# Patient Record
Sex: Female | Born: 1955 | Race: White | Hispanic: No | Marital: Married | Smoking: Never smoker
Health system: Southern US, Community
[De-identification: ages and names within clinical notes are randomized; demographics above are authoritative.]

## PROBLEM LIST (undated history)

## (undated) DIAGNOSIS — E119 Type 2 diabetes mellitus without complications: Secondary | ICD-10-CM

## (undated) DIAGNOSIS — N950 Postmenopausal bleeding: Secondary | ICD-10-CM

## (undated) DIAGNOSIS — G43909 Migraine, unspecified, not intractable, without status migrainosus: Secondary | ICD-10-CM

## (undated) HISTORY — PX: COLON BIOPSY: SHX1369

## (undated) HISTORY — DX: Migraine, unspecified, not intractable, without status migrainosus: G43.909

## (undated) HISTORY — PX: TONSILLECTOMY: SUR1361

## (undated) HISTORY — PX: OTHER SURGICAL HISTORY: SHX169

## (undated) HISTORY — PX: CARPAL TUNNEL RELEASE: SHX101

## (undated) HISTORY — DX: Type 2 diabetes mellitus without complications: E11.9

---

## 1999-02-16 ENCOUNTER — Encounter: Payer: Self-pay | Admitting: Obstetrics and Gynecology

## 1999-02-16 ENCOUNTER — Encounter: Admission: RE | Admit: 1999-02-16 | Discharge: 1999-02-16 | Payer: Self-pay | Admitting: Obstetrics and Gynecology

## 1999-07-04 ENCOUNTER — Other Ambulatory Visit: Admission: RE | Admit: 1999-07-04 | Discharge: 1999-07-04 | Payer: Self-pay | Admitting: Obstetrics and Gynecology

## 2000-02-22 ENCOUNTER — Encounter: Payer: Self-pay | Admitting: Obstetrics and Gynecology

## 2000-02-22 ENCOUNTER — Encounter: Admission: RE | Admit: 2000-02-22 | Discharge: 2000-02-22 | Payer: Self-pay | Admitting: Obstetrics and Gynecology

## 2000-04-02 ENCOUNTER — Other Ambulatory Visit: Admission: RE | Admit: 2000-04-02 | Discharge: 2000-04-02 | Payer: Self-pay | Admitting: Obstetrics and Gynecology

## 2007-06-17 ENCOUNTER — Ambulatory Visit (HOSPITAL_BASED_OUTPATIENT_CLINIC_OR_DEPARTMENT_OTHER): Admission: RE | Admit: 2007-06-17 | Discharge: 2007-06-17 | Payer: Self-pay | Admitting: Orthopaedic Surgery

## 2008-09-28 ENCOUNTER — Encounter: Admission: RE | Admit: 2008-09-28 | Discharge: 2008-09-28 | Payer: Self-pay | Admitting: Gynecology

## 2008-09-28 ENCOUNTER — Encounter: Payer: Self-pay | Admitting: Gynecology

## 2008-09-28 ENCOUNTER — Other Ambulatory Visit: Admission: RE | Admit: 2008-09-28 | Discharge: 2008-09-28 | Payer: Self-pay | Admitting: Gynecology

## 2008-09-28 ENCOUNTER — Ambulatory Visit: Payer: Self-pay | Admitting: Gynecology

## 2009-05-13 ENCOUNTER — Ambulatory Visit: Payer: Self-pay | Admitting: Gynecology

## 2009-07-04 ENCOUNTER — Ambulatory Visit: Payer: Self-pay | Admitting: Gynecology

## 2009-07-12 ENCOUNTER — Ambulatory Visit: Payer: Self-pay | Admitting: Gynecology

## 2009-10-07 ENCOUNTER — Encounter: Admission: RE | Admit: 2009-10-07 | Discharge: 2009-10-07 | Payer: Self-pay | Admitting: Gynecology

## 2009-10-07 ENCOUNTER — Other Ambulatory Visit: Admission: RE | Admit: 2009-10-07 | Discharge: 2009-10-07 | Payer: Self-pay | Admitting: Gynecology

## 2009-10-07 ENCOUNTER — Ambulatory Visit: Payer: Self-pay | Admitting: Gynecology

## 2009-10-13 ENCOUNTER — Ambulatory Visit: Payer: Self-pay | Admitting: Gynecology

## 2009-11-24 ENCOUNTER — Ambulatory Visit: Payer: Self-pay | Admitting: Gynecology

## 2010-03-19 ENCOUNTER — Emergency Department (HOSPITAL_BASED_OUTPATIENT_CLINIC_OR_DEPARTMENT_OTHER)
Admission: EM | Admit: 2010-03-19 | Discharge: 2010-03-19 | Payer: Self-pay | Source: Home / Self Care | Admitting: Emergency Medicine

## 2010-08-01 NOTE — Op Note (Signed)
NAMEALICYN, KLANN                  ACCOUNT NO.:  [id number]   MEDICAL RECORD NO.:  [id number]          PATIENT TYPE:  AMB   LOCATION:  DSC                          FACILITY:  [facility]   PHYSICIAN:  Lubertha Basque. Dalldorf, M.D.DATE OF BIRTH:  October 24, 1955   DATE OF PROCEDURE:  06/17/2007  DATE OF DISCHARGE:                               OPERATIVE REPORT   PREOPERATIVE DIAGNOSIS:  Right carpal tunnel syndrome.   POSTOPERATIVE DIAGNOSIS:  Right carpal tunnel syndrome.   PROCEDURE:  Right carpal tunnel release.   ANESTHESIA:  General.   ATTENDING SURGEON:  Marcene Corning, M.D.   ASSISTANT:  Lindwood Qua, PA.   INDICATIONS FOR PROCEDURE:  The patient is a 55 year old woman with a  long history of right hand numbness.  This has persisted despite oral  anti-inflammatories and a brace.  She has undergone a nerve conduction  study which shows significant carpal tunnel syndrome.  She is offered  release as she has pain using her hand.  Informed operative consent was  obtained after discussion of possible complications of reaction to  anesthesia, infection, and neurovascular injury.   SUMMARY OF FINDINGS AND PROCEDURE:  Under general anesthesia through a  small palmar incision, a right carpal tunnel release was performed.  She  did have moderate thickening of the transverse carpal ligament.  She was  closed primarily and discharged home.   DESCRIPTION OF PROCEDURE:  The patient was taken to the operating suite  where general anesthetic was applied without difficulty.  She was  positioned supine, prepped and draped in normal sterile fashion.  After  the administration of IV Kefzol the right arm was elevated,  exsanguinated, and tourniquet inflated about the upper arm.  A small  palmar incision was made which was ulnar to the thenar flexion crease  and did not cross the wrist flexion crease.  Dissection was carried down  through the palmar fascia to the transverse carpal ligament which was  released under direct visualization.  The release was taken distally  towards the transverse arch of vessels and proximally through the distal  fascia of the forearm.  The median nerve did appear to be moderately  compressed underneath.  The wound was irrigated followed by  reapproximation of the skin with vertical mattress nylon.  The  tourniquet was deflated and a small amount of bleeding was easily  controlled with some pressure.  Some Marcaine was injected about the  incision site followed by Adaptic and a dry gauze dressing with a volar  splint of plaster with the wrist in slight extension.  Estimated blood  loss and intraoperative fluids can be obtained from the Anesthesia  records as can accurate tourniquet time.   DISPOSITION:  The patient was extubated in the operating room and taken  to the recovery room in stable addition.  She was to go home same-day  and follow up in the office next week.  I will contact her by phone  tonight.      Lubertha Basque Jerl Santos, M.D.  Electronically Signed     PGD/MEDQ  D:  06/17/2007  T:  06/17/2007  Job:  [id number]

## 2010-09-22 ENCOUNTER — Other Ambulatory Visit: Payer: Self-pay | Admitting: Gynecology

## 2010-09-22 DIAGNOSIS — Z1231 Encounter for screening mammogram for malignant neoplasm of breast: Secondary | ICD-10-CM

## 2010-10-13 ENCOUNTER — Ambulatory Visit (INDEPENDENT_AMBULATORY_CARE_PROVIDER_SITE_OTHER): Payer: PRIVATE HEALTH INSURANCE | Admitting: Gynecology

## 2010-10-13 ENCOUNTER — Other Ambulatory Visit (HOSPITAL_COMMUNITY)
Admission: RE | Admit: 2010-10-13 | Discharge: 2010-10-13 | Disposition: A | Discharge status: Home / Self Care | Payer: PRIVATE HEALTH INSURANCE | Source: Ambulatory Visit | Attending: Gynecology | Admitting: Gynecology

## 2010-10-13 ENCOUNTER — Ambulatory Visit
Admission: RE | Admit: 2010-10-13 | Discharge: 2010-10-13 | Disposition: A | Discharge status: Home / Self Care | Payer: PRIVATE HEALTH INSURANCE | Source: Ambulatory Visit | Attending: Gynecology | Admitting: Gynecology

## 2010-10-13 ENCOUNTER — Encounter: Payer: Self-pay | Admitting: Gynecology

## 2010-10-13 VITALS — BP 130/78 | Ht 63.75 in | Wt 156.0 lb

## 2010-10-13 DIAGNOSIS — N898 Other specified noninflammatory disorders of vagina: Secondary | ICD-10-CM

## 2010-10-13 DIAGNOSIS — R5383 Other fatigue: Secondary | ICD-10-CM

## 2010-10-13 DIAGNOSIS — K921 Melena: Secondary | ICD-10-CM

## 2010-10-13 DIAGNOSIS — Z01419 Encounter for gynecological examination (general) (routine) without abnormal findings: Secondary | ICD-10-CM

## 2010-10-13 DIAGNOSIS — Z780 Asymptomatic menopausal state: Secondary | ICD-10-CM

## 2010-10-13 DIAGNOSIS — R5381 Other malaise: Secondary | ICD-10-CM

## 2010-10-13 DIAGNOSIS — Z833 Family history of diabetes mellitus: Secondary | ICD-10-CM

## 2010-10-13 DIAGNOSIS — Z13220 Encounter for screening for lipoid disorders: Secondary | ICD-10-CM

## 2010-10-13 DIAGNOSIS — Z1231 Encounter for screening mammogram for malignant neoplasm of breast: Secondary | ICD-10-CM

## 2010-10-13 DIAGNOSIS — R8290 Unspecified abnormal findings in urine: Secondary | ICD-10-CM

## 2010-10-13 DIAGNOSIS — R635 Abnormal weight gain: Secondary | ICD-10-CM

## 2010-10-13 DIAGNOSIS — R82998 Other abnormal findings in urine: Secondary | ICD-10-CM

## 2010-10-13 LAB — HEMOCCULT GUIAC POC 1CARD (OFFICE)

## 2010-10-13 MED ORDER — PROGESTERONE MICRONIZED 200 MG PO CAPS: 200.0000 mg | ORAL_CAPSULE | Freq: Every day | ORAL | Status: DC

## 2010-10-13 MED ORDER — ESTRADIOL 0.52 MG/0.87 GM (0.06%) TD GEL: 1.0000 "application " | Freq: Two times a day (BID) | TRANSDERMAL | Status: DC

## 2010-10-13 MED ORDER — FLUCONAZOLE 150 MG PO TABS: 150.0000 mg | ORAL_TABLET | Freq: Once | ORAL | Status: AC

## 2010-10-13 NOTE — Progress Notes (Signed)
Addended byCammie Mcgee T on: 10/13/2010 11:30 AM   Modules accepted: Orders

## 2010-10-13 NOTE — Progress Notes (Signed)
Subjective:     Patient ID: Christina Church, female   DOB: 25-Oct-1955, 55 y.o.   MRN: [id number]  HPI patient presented to the office today for her annual gynecological examination. She's on hormone replacement therapy consisting of Elestrin 0.06% transdermally which she applies on a daily basis. She also takes Prometrium 200 mg for 10 days of each month. Her complaint today as part of her annual exam was tired has fatigue and a slight vaginal discharge and odor in her urine. She denied any fever chills nausea or vomiting or any dysuria.   Review of Systems  Constitutional: Negative.   HENT: Negative.   Eyes: Negative.   Respiratory: Negative.   Cardiovascular: Negative.   Gastrointestinal: Negative.   Genitourinary: Negative.   Musculoskeletal: Negative.   Skin: Negative.   Neurological: Negative.   Hematological: Negative.   Psychiatric/Behavioral: Negative.        Objective:   Physical Exam  Constitutional: She is oriented to person, place, and time. She appears well-developed and well-nourished.  HENT:  Head: Normocephalic and atraumatic.  Eyes: Pupils are equal, round, and reactive to light.  Neck: Normal range of motion. Neck supple.  Cardiovascular: Normal rate and regular rhythm.   Pulmonary/Chest: Effort normal and breath sounds normal.  Abdominal: Soft. Bowel sounds are normal.  Genitourinary: Vagina normal and uterus normal.       Bartholin urethral Skene glands: Vagina: Normal Cervix: No gross lesions Uterus: Anteverted normal size shape and consistency Adnexa: No masses or tenderness Rectal: Small external hemorrhoidal tag  Musculoskeletal: Normal range of motion.  Neurological: She is alert and oriented to person, place, and time.  Skin: Skin is warm and dry.       Assessment:     Patient had unremarkable her annual gynecological examination. Wet prep demonstrated yeast and she'll be treated with Diflucan 150 mg 1 by mouth daily. Her urinalysis had 1+ bacteria  1-2 RBC and was sent for culture. She is scheduled for mammogram today. In her colonoscopies not due for 3 more years. She is due for bone density study next year.    Plan:     Patient will pass by the laboratory for the following labs: CBC, TSH, fasting lipid profile, fasting blood sugar. Her Hemoccult test was positive. She does have hemorrhoids. We'll give her Hemoccult tests kit to submit to the office next week. In the posterior present and referred to gastroenterologist. On palpation there is any abnormality in the above-mentioned test.

## 2010-10-13 NOTE — Patient Instructions (Signed)
Will notify if there is any abnormality of the left as there were drawn today. Your prescription for your hormone replacement therapies at her pharmacy. He right eye demonstrating a mild yeast infection and for this reason a carbon prescription for Diflucan to take one tablet today and 2 refills. Please make sure he gets her mammogram today in at the 4-6 copy of that report. Your colonoscopy is now due for 3 years. Your bone density study should be scheduled for next year.

## 2010-10-18 ENCOUNTER — Telehealth: Payer: Self-pay | Admitting: *Deleted

## 2010-10-18 NOTE — Telephone Encounter (Signed)
Patient called today with concerns that she again in the wrong information on her home replacement therapy. She is not taking Prometrium and she had told me she is taking Provera. We will reiterated to her once again the following: She should be applying Elestrin 0.06% transdermal gel once a day and 1 arm only. She is to take the Provera 10 mg for 10 days of the month only orally.

## 2010-10-18 NOTE — Telephone Encounter (Signed)
(  SEE OFFICE NOTE 10/13/10 EMR CHART) PT CALLED STATING THAT SHE TOLD YOU THE WRONG MEDICATION ON OV 10/13/10. SHE TAKES PROVERA 10MG  1PO Q DAY. NOT PROMETRIUM 200MG  Q DAY. PT WANTS TO KNOW DOES SHE STILL NEED TO TAKE THE PROMETRIUM SHE HAS AN RX FOR THIS ALREADY? BUT SHE DOES NEED REFILLS ON HER PROVERA. PLEASE ADVISE.

## 2010-10-19 ENCOUNTER — Other Ambulatory Visit: Payer: Self-pay | Admitting: *Deleted

## 2010-10-19 MED ORDER — MEDROXYPROGESTERONE ACETATE 10 MG PO TABS: 10.0000 mg | ORAL_TABLET | Freq: Every day | ORAL | Status: DC

## 2010-10-19 NOTE — Telephone Encounter (Signed)
PT INFORMED WITH THE BELOW RX REFILL SENT TO PHARMACY.

## 2010-10-26 DIAGNOSIS — Z1211 Encounter for screening for malignant neoplasm of colon: Secondary | ICD-10-CM

## 2010-10-30 ENCOUNTER — Other Ambulatory Visit: Payer: Self-pay | Admitting: *Deleted

## 2010-10-30 DIAGNOSIS — Z1211 Encounter for screening for malignant neoplasm of colon: Secondary | ICD-10-CM

## 2010-12-11 LAB — POCT HEMOGLOBIN-HEMACUE: Hemoglobin: 13.6

## 2011-05-10 ENCOUNTER — Other Ambulatory Visit: Payer: Self-pay | Admitting: *Deleted

## 2011-05-10 MED ORDER — MEDROXYPROGESTERONE ACETATE 10 MG PO TABS: ORAL_TABLET | ORAL | Status: DC

## 2011-09-06 ENCOUNTER — Other Ambulatory Visit: Payer: Self-pay | Admitting: Gynecology

## 2011-09-06 DIAGNOSIS — Z1231 Encounter for screening mammogram for malignant neoplasm of breast: Secondary | ICD-10-CM

## 2011-10-18 ENCOUNTER — Ambulatory Visit: Payer: PRIVATE HEALTH INSURANCE

## 2011-10-25 ENCOUNTER — Encounter: Payer: Self-pay | Admitting: Gynecology

## 2011-10-25 ENCOUNTER — Ambulatory Visit (INDEPENDENT_AMBULATORY_CARE_PROVIDER_SITE_OTHER): Payer: Managed Care, Other (non HMO) | Admitting: Gynecology

## 2011-10-25 ENCOUNTER — Ambulatory Visit
Admission: RE | Admit: 2011-10-25 | Discharge: 2011-10-25 | Disposition: A | Discharge status: Home / Self Care | Payer: Private Health Insurance - Indemnity | Source: Ambulatory Visit | Attending: Gynecology | Admitting: Gynecology

## 2011-10-25 VITALS — BP 126/78 | Ht 64.0 in | Wt 158.0 lb

## 2011-10-25 DIAGNOSIS — R5381 Other malaise: Secondary | ICD-10-CM

## 2011-10-25 DIAGNOSIS — R635 Abnormal weight gain: Secondary | ICD-10-CM

## 2011-10-25 DIAGNOSIS — R5383 Other fatigue: Secondary | ICD-10-CM | POA: Insufficient documentation

## 2011-10-25 DIAGNOSIS — Z1231 Encounter for screening mammogram for malignant neoplasm of breast: Secondary | ICD-10-CM

## 2011-10-25 DIAGNOSIS — Z01419 Encounter for gynecological examination (general) (routine) without abnormal findings: Secondary | ICD-10-CM

## 2011-10-25 DIAGNOSIS — N949 Unspecified condition associated with female genital organs and menstrual cycle: Secondary | ICD-10-CM

## 2011-10-25 DIAGNOSIS — N898 Other specified noninflammatory disorders of vagina: Secondary | ICD-10-CM

## 2011-10-25 DIAGNOSIS — Z79890 Hormone replacement therapy (postmenopausal): Secondary | ICD-10-CM

## 2011-10-25 LAB — CBC WITH DIFFERENTIAL/PLATELET
Basophils Absolute: 0 10*3/uL (ref 0.0–0.1)
Basophils Relative: 0 % (ref 0–1)
Hemoglobin: 13.7 g/dL (ref 12.0–15.0)
Lymphocytes Relative: 31 % (ref 12–46)
MCHC: 33.8 g/dL (ref 30.0–36.0)
Monocytes Relative: 5 % (ref 3–12)
Neutro Abs: 4.2 10*3/uL (ref 1.7–7.7)
Neutrophils Relative %: 62 % (ref 43–77)
WBC: 6.9 10*3/uL (ref 4.0–10.5)

## 2011-10-25 LAB — WET PREP FOR TRICH, YEAST, CLUE
Clue Cells Wet Prep HPF POC: NONE SEEN
Trich, Wet Prep: NONE SEEN
Yeast Wet Prep HPF POC: NONE SEEN

## 2011-10-25 LAB — URINALYSIS W MICROSCOPIC + REFLEX CULTURE
Hgb urine dipstick: NEGATIVE
Leukocytes, UA: NEGATIVE
Nitrite: NEGATIVE
Specific Gravity, Urine: 1.02 (ref 1.005–1.030)
pH: 7 (ref 5.0–8.0)

## 2011-10-25 LAB — COMPREHENSIVE METABOLIC PANEL
AST: 27 U/L (ref 0–37)
Albumin: 4.3 g/dL (ref 3.5–5.2)
Alkaline Phosphatase: 81 U/L (ref 39–117)
BUN: 18 mg/dL (ref 6–23)
Creat: 0.68 mg/dL (ref 0.50–1.10)
Glucose, Bld: 81 mg/dL (ref 70–99)
Total Bilirubin: 0.3 mg/dL (ref 0.3–1.2)

## 2011-10-25 LAB — CHOLESTEROL, TOTAL: Cholesterol: 182 mg/dL (ref 0–200)

## 2011-10-25 MED ORDER — ESTRADIOL 1 MG PO TABS: 1.0000 mg | ORAL_TABLET | Freq: Every day | ORAL | Status: DC

## 2011-10-25 MED ORDER — CLINDAMYCIN PHOSPHATE 2 % VA CREA: 1.0000 | TOPICAL_CREAM | Freq: Every day | VAGINAL | Status: AC

## 2011-10-25 MED ORDER — PROGESTERONE MICRONIZED 200 MG PO CAPS: ORAL_CAPSULE | ORAL | Status: DC

## 2011-10-25 NOTE — Progress Notes (Signed)
Christina Church [birth year] [id number]   History:    56 y.o.  for annual gyn exam who was complaining of vaginal odor as well as some tiredness and weight gain. Patient also states that despite her being on the elestrin 0.06% transdermal to pump daily she continues to have vasomotor symptoms. She is also taking Prometrium 200 mg for 12 days of the month. She is in a monogamous relationship. She's had some urinary frequency but no dysuria. Her mammogram was done today results pending. She does her monthly self breast examination. Last colonoscopy was in 2010 which was normal. Her last bone density study was normal in 2011. Patient had had it to her regimen for hormone replacement therapy a drug called Amberen which she stayed help some of her symptoms. I review the labile and contains no phytoestrogens.  Past medical history,surgical history, family history and social history were all reviewed and documented in the EPIC chart.  Gynecologic History Patient's last menstrual period was 10/13/2007. Contraception: none Last Pap: 2012. Results were: normal Last mammogram: Today. Results were: Results pending  Obstetric History OB History    Grav Para Term Preterm Abortions TAB SAB Ect Mult Living   1 1 1       1      # Outc Date GA Lbr Len/2nd Wgt Sex Del Anes PTL Lv   1 TRM     F SVD  No Yes       ROS: A ROS was performed and pertinent positives and negatives are included in the history.  GENERAL: No fevers or chills. HEENT: No change in vision, no earache, sore throat or sinus congestion. NECK: No pain or stiffness. CARDIOVASCULAR: No chest pain or pressure. No palpitations. PULMONARY: No shortness of breath, cough or wheeze. GASTROINTESTINAL: No abdominal pain, nausea, vomiting or diarrhea, melena or bright red blood per rectum. GENITOURINARY: No urinary frequency, urgency, hesitancy or dysuria. MUSCULOSKELETAL: No joint or muscle pain, no back pain, no recent trauma. DERMATOLOGIC: No rash, no itching,  no lesions. ENDOCRINE: No polyuria, polydipsia, no heat or cold intolerance. No recent change in weight. HEMATOLOGICAL: No anemia or easy bruising or bleeding. NEUROLOGIC: No headache, seizures, numbness, tingling or weakness. PSYCHIATRIC: No depression, no loss of interest in normal activity or change in sleep pattern.     Exam: chaperone present  BP 126/78  Ht 5\' 4"  (1.626 m)  Wt 158 lb (71.668 kg)  BMI 27.12 kg/m2  LMP 10/13/2007  Body mass index is 27.12 kg/(m^2).  General appearance : Well developed well nourished female. No acute distress HEENT: Neck supple, trachea midline, no carotid bruits, no thyroidmegaly Lungs: Clear to auscultation, no rhonchi or wheezes, or rib retractions  Heart: Regular rate and rhythm, no murmurs or gallops Breast:Examined in sitting and supine position were symmetrical in appearance, no palpable masses or tenderness,  no skin retraction, no nipple inversion, no nipple discharge, no skin discoloration, no axillary or supraclavicular lymphadenopathy Abdomen: no palpable masses or tenderness, no rebound or guarding Extremities: no edema or skin discoloration or tenderness  Pelvic:  Bartholin, Urethra, Skene Glands: Within normal limits             Vagina: No gross lesions or discharge  Cervix: No gross lesions or discharge  Uterus  anteverted, normal size, shape and consistency, non-tender and mobile  Adnexa  Without masses or tenderness  Anus and perineum  normal   Rectovaginal  normal sphincter tone without palpated masses or tenderness  Hemoccult cards provided for patient to submit to the office for testing.     Assessment/Plan:  56 y.o. female for annual exam who was complaining of some tiredness and fatigue. We'll check a comprehensive metabolic panel along with a TSH, CBC, total cholesterol, and urinalysis. We did discuss the new Pap smear screening guidelines and she will not need 1 for 2 more years. She will schedule her bone  density study for next month. Her wet prep demonstrated moderate amount of white blood cells and many bacteria. She was given a prescription for Cleocin vaginal cream to apply each bedtime for 5 days. After that she can use refresh a probiotic vaginal gel to 3 times a week. Her urinalysis was negative. She was reminded to do her monthly self breast examination. We discussed importance of calcium vitamin D for osteoporosis prevention. She was reminded to submit to the office the Hemoccult cards for testing. We're going to discontinue the elestrin transdermal estrogen and start her on the Estrace 1 mg by mouth daily with the addition of Prometrium 200 mg for 12 days of the month and monitor her symptoms.    Ok Edwards MD, 12:38 PM 10/25/2011

## 2011-10-25 NOTE — Patient Instructions (Addendum)
Exercise to Lose Weight Exercise and a healthy diet may help you lose weight. Your doctor may suggest specific exercises. EXERCISE IDEAS AND TIPS  Choose low-cost things you enjoy doing, such as walking, bicycling, or exercising to workout videos.   Take stairs instead of the elevator.   Walk during your lunch break.   Park your car further away from work or school.   Go to a gym or an exercise class.   Start with 5 to 10 minutes of exercise each day. Build up to 30 minutes of exercise 4 to 6 days a week.   Wear shoes with good support and comfortable clothes.   Stretch before and after working out.   Work out until you breathe harder and your heart beats faster.   Drink extra water when you exercise.   Do not do so much that you hurt yourself, feel dizzy, or get very short of breath.  Exercises that burn about 150 calories:  Running 1  miles in 15 minutes.   Playing volleyball for 45 to 60 minutes.   Washing and waxing a car for 45 to 60 minutes.   Playing touch football for 45 minutes.   Walking 1  miles in 35 minutes.   Pushing a stroller 1  miles in 30 minutes.   Playing basketball for 30 minutes.   Raking leaves for 30 minutes.   Bicycling 5 miles in 30 minutes.   Walking 2 miles in 30 minutes.   Dancing for 30 minutes.   Shoveling snow for 15 minutes.   Swimming laps for 20 minutes.   Walking up stairs for 15 minutes.   Bicycling 4 miles in 15 minutes.   Gardening for 30 to 45 minutes.   Jumping rope for 15 minutes.   Washing windows or floors for 45 to 60 minutes.  Document Released: 04/07/2010 Document Revised: 11/15/2010 Document Reviewed: 04/07/2010 [employer] Patient Information 2012 [employer].                                                  Cholesterol Control Diet  Cholesterol levels in your body are determined significantly by your diet. Cholesterol levels may also be related to heart disease. The following material helps to  explain this relationship and discusses what you can do to help keep your heart healthy. Not all cholesterol is bad. Low-density lipoprotein (LDL) cholesterol is the "bad" cholesterol. It may cause fatty deposits to build up inside your arteries. High-density lipoprotein (HDL) cholesterol is "good." It helps to remove the "bad" LDL cholesterol from your blood. Cholesterol is a very important risk factor for heart disease. Other risk factors are high blood pressure, smoking, stress, heredity, and weight. The heart muscle gets its supply of blood through the coronary arteries. If your LDL cholesterol is high and your HDL cholesterol is low, you are at risk for having fatty deposits build up in your coronary arteries. This leaves less room through which blood can flow. Without sufficient blood and oxygen, the heart muscle cannot function properly and you may feel chest pains (angina pectoris). When a coronary artery closes up entirely, a part of the heart muscle may die, causing a heart attack (myocardial infarction). CHECKING CHOLESTEROL When your caregiver sends your blood to a lab to be analyzed for cholesterol, a complete lipid (fat) profile may be done. With   this test, the total amount of cholesterol and levels of LDL and HDL are determined. Triglycerides are a type of fat that circulates in the blood and can also be used to determine heart disease risk. The list below describes what the numbers should be: Test: Total Cholesterol.  Less than 200 mg/dl.  Test: LDL "bad cholesterol."  Less than 100 mg/dl.   Less than 70 mg/dl if you are at very high risk of a heart attack or sudden cardiac death.  Test: HDL "good cholesterol."  Greater than 50 mg/dl for women.   Greater than 40 mg/dl for men.  Test: Triglycerides.  Less than 150 mg/dl.  CONTROLLING CHOLESTEROL WITH DIET Although exercise and lifestyle factors are important, your diet is key. That is because certain foods are known to raise  cholesterol and others to lower it. The goal is to balance foods for their effect on cholesterol and more importantly, to replace saturated and trans fat with other types of fat, such as monounsaturated fat, polyunsaturated fat, and omega-3 fatty acids. On average, a person should consume no more than 15 to 17 g of saturated fat daily. Saturated and trans fats are considered "bad" fats, and they will raise LDL cholesterol. Saturated fats are primarily found in animal products such as meats, butter, and cream. However, that does not mean you need to sacrifice all your favorite foods. Today, there are good tasting, low-fat, low-cholesterol substitutes for most of the things you like to eat. Choose low-fat or nonfat alternatives. Choose round or loin cuts of red meat, since these types of cuts are lowest in fat and cholesterol. Chicken (without the skin), fish, veal, and ground turkey breast are excellent choices. Eliminate fatty meats, such as hot dogs and salami. Even shellfish have little or no saturated fat. Have a 3 oz (85 g) portion when you eat lean meat, poultry, or fish. Trans fats are also called "partially hydrogenated oils." They are oils that have been scientifically manipulated so that they are solid at room temperature resulting in a longer shelf life and improved taste and texture of foods in which they are added. Trans fats are found in stick margarine, some tub margarines, cookies, crackers, and baked goods.  When baking and cooking, oils are an excellent substitute for butter. The monounsaturated oils are especially beneficial since it is believed they lower LDL and raise HDL. The oils you should avoid entirely are saturated tropical oils, such as coconut and palm.  Remember to eat liberally from food groups that are naturally free of saturated and trans fat, including fish, fruit, vegetables, beans, grains (barley, rice, couscous, bulgur wheat), and pasta (without cream sauces).  IDENTIFYING  FOODS THAT LOWER CHOLESTEROL  Soluble fiber may lower your cholesterol. This type of fiber is found in fruits such as apples, vegetables such as broccoli, potatoes, and carrots, legumes such as beans, peas, and lentils, and grains such as barley. Foods fortified with plant sterols (phytosterol) may also lower cholesterol. You should eat at least 2 g per day of these foods for a cholesterol lowering effect.  Read package labels to identify low-saturated fats, trans fats free, and low-fat foods at the supermarket. Select cheeses that have only 2 to 3 g saturated fat per ounce. Use a heart-healthy tub margarine that is free of trans fats or partially hydrogenated oil. When buying baked goods (cookies, crackers), avoid partially hydrogenated oils. Breads and muffins should be made from whole grains (whole-wheat or whole oat flour, instead of "flour" or "  enriched flour"). Buy non-creamy canned soups with reduced salt and no added fats.  FOOD PREPARATION TECHNIQUES  Never deep-fry. If you must fry, either stir-fry, which uses very little fat, or use non-stick cooking sprays. When possible, broil, bake, or roast meats, and steam vegetables. Instead of dressing vegetables with butter or margarine, use lemon and herbs, applesauce and cinnamon (for squash and sweet potatoes), nonfat yogurt, salsa, and low-fat dressings for salads.  LOW-SATURATED FAT / LOW-FAT FOOD SUBSTITUTES Meats / Saturated Fat (g)  Avoid: Steak, marbled (3 oz/85 g) / 11 g   Choose: Steak, lean (3 oz/85 g) / 4 g   Avoid: Hamburger (3 oz/85 g) / 7 g   Choose: Hamburger, lean (3 oz/85 g) / 5 g   Avoid: Ham (3 oz/85 g) / 6 g   Choose: Ham, lean cut (3 oz/85 g) / 2.4 g   Avoid: Chicken, with skin, dark meat (3 oz/85 g) / 4 g   Choose: Chicken, skin removed, dark meat (3 oz/85 g) / 2 g   Avoid: Chicken, with skin, light meat (3 oz/85 g) / 2.5 g   Choose: Chicken, skin removed, light meat (3 oz/85 g) / 1 g  Dairy / Saturated Fat  (g)  Avoid: Whole milk (1 cup) / 5 g   Choose: Low-fat milk, 2% (1 cup) / 3 g   Choose: Low-fat milk, 1% (1 cup) / 1.5 g   Choose: Skim milk (1 cup) / 0.3 g   Avoid: Hard cheese (1 oz/28 g) / 6 g   Choose: Skim milk cheese (1 oz/28 g) / 2 to 3 g   Avoid: Cottage cheese, 4% fat (1 cup) / 6.5 g   Choose: Low-fat cottage cheese, 1% fat (1 cup) / 1.5 g   Avoid: Ice cream (1 cup) / 9 g   Choose: Sherbet (1 cup) / 2.5 g   Choose: Nonfat frozen yogurt (1 cup) / 0.3 g   Choose: Frozen fruit bar / trace   Avoid: Whipped cream (1 tbs) / 3.5 g   Choose: Nondairy whipped topping (1 tbs) / 1 g  Condiments / Saturated Fat (g)  Avoid: Mayonnaise (1 tbs) / 2 g   Choose: Low-fat mayonnaise (1 tbs) / 1 g   Avoid: Butter (1 tbs) / 7 g   Choose: Extra light margarine (1 tbs) / 1 g   Avoid: Coconut oil (1 tbs) / 11.8 g   Choose: Olive oil (1 tbs) / 1.8 g   Choose: Corn oil (1 tbs) / 1.7 g   Choose: Safflower oil (1 tbs) / 1.2 g   Choose: Sunflower oil (1 tbs) / 1.4 g   Choose: Soybean oil (1 tbs) / 2.4 g   Choose: Canola oil (1 tbs) / 1 g  Document Released: 03/05/2005 Document Revised: 11/15/2010 Document Reviewed: 08/24/2010 [employer] Patient Information 2012 [employer].   

## 2011-11-22 ENCOUNTER — Ambulatory Visit (INDEPENDENT_AMBULATORY_CARE_PROVIDER_SITE_OTHER): Payer: Managed Care, Other (non HMO)

## 2011-11-22 DIAGNOSIS — Z79890 Hormone replacement therapy (postmenopausal): Secondary | ICD-10-CM

## 2011-11-22 DIAGNOSIS — Z13820 Encounter for screening for osteoporosis: Secondary | ICD-10-CM

## 2012-09-26 ENCOUNTER — Other Ambulatory Visit: Payer: Self-pay

## 2012-09-26 DIAGNOSIS — Z1231 Encounter for screening mammogram for malignant neoplasm of breast: Secondary | ICD-10-CM

## 2012-10-29 ENCOUNTER — Other Ambulatory Visit: Payer: Self-pay

## 2012-10-29 DIAGNOSIS — Z79890 Hormone replacement therapy (postmenopausal): Secondary | ICD-10-CM

## 2012-10-29 MED ORDER — ESTRADIOL 1 MG PO TABS: 1.0000 mg | ORAL_TABLET | Freq: Every day | ORAL | Status: DC

## 2012-10-29 NOTE — Telephone Encounter (Signed)
Patient has yearly exam scheduled in August.

## 2012-11-07 ENCOUNTER — Encounter: Payer: Self-pay | Admitting: Gynecology

## 2012-11-07 ENCOUNTER — Ambulatory Visit
Admission: RE | Admit: 2012-11-07 | Discharge: 2012-11-07 | Disposition: A | Discharge status: Home / Self Care | Payer: Managed Care, Other (non HMO) | Source: Ambulatory Visit

## 2012-11-07 ENCOUNTER — Ambulatory Visit (INDEPENDENT_AMBULATORY_CARE_PROVIDER_SITE_OTHER): Payer: Managed Care, Other (non HMO) | Admitting: Gynecology

## 2012-11-07 VITALS — BP 128/82 | Ht 63.75 in | Wt 155.0 lb

## 2012-11-07 DIAGNOSIS — Z01419 Encounter for gynecological examination (general) (routine) without abnormal findings: Secondary | ICD-10-CM

## 2012-11-07 DIAGNOSIS — Z1231 Encounter for screening mammogram for malignant neoplasm of breast: Secondary | ICD-10-CM

## 2012-11-07 DIAGNOSIS — K644 Residual hemorrhoidal skin tags: Secondary | ICD-10-CM

## 2012-11-07 DIAGNOSIS — K649 Unspecified hemorrhoids: Secondary | ICD-10-CM | POA: Insufficient documentation

## 2012-11-07 DIAGNOSIS — N949 Unspecified condition associated with female genital organs and menstrual cycle: Secondary | ICD-10-CM

## 2012-11-07 DIAGNOSIS — Z1159 Encounter for screening for other viral diseases: Secondary | ICD-10-CM

## 2012-11-07 DIAGNOSIS — R5381 Other malaise: Secondary | ICD-10-CM

## 2012-11-07 DIAGNOSIS — Z780 Asymptomatic menopausal state: Secondary | ICD-10-CM

## 2012-11-07 DIAGNOSIS — N951 Menopausal and female climacteric states: Secondary | ICD-10-CM

## 2012-11-07 DIAGNOSIS — R5383 Other fatigue: Secondary | ICD-10-CM

## 2012-11-07 DIAGNOSIS — Z79890 Hormone replacement therapy (postmenopausal): Secondary | ICD-10-CM

## 2012-11-07 DIAGNOSIS — N898 Other specified noninflammatory disorders of vagina: Secondary | ICD-10-CM

## 2012-11-07 DIAGNOSIS — Z23 Encounter for immunization: Secondary | ICD-10-CM

## 2012-11-07 LAB — WET PREP FOR TRICH, YEAST, CLUE: Clue Cells Wet Prep HPF POC: NONE SEEN

## 2012-11-07 LAB — COMPREHENSIVE METABOLIC PANEL
Albumin: 4.1 g/dL (ref 3.5–5.2)
CO2: 27 mEq/L (ref 19–32)
Calcium: 9.1 mg/dL (ref 8.4–10.5)
Chloride: 103 mEq/L (ref 96–112)
Glucose, Bld: 74 mg/dL (ref 70–99)
Potassium: 4.2 mEq/L (ref 3.5–5.3)
Sodium: 138 mEq/L (ref 135–145)
Total Protein: 7.1 g/dL (ref 6.0–8.3)

## 2012-11-07 LAB — LIPID PANEL
Cholesterol: 179 mg/dL (ref 0–200)
Triglycerides: 80 mg/dL (ref <150)

## 2012-11-07 LAB — CBC WITH DIFFERENTIAL/PLATELET
Eosinophils Absolute: 0.2 10*3/uL (ref 0.0–0.7)
Hemoglobin: 13.9 g/dL (ref 12.0–15.0)
Lymphs Abs: 1.8 10*3/uL (ref 0.7–4.0)
MCH: 28.6 pg (ref 26.0–34.0)
Monocytes Relative: 5 % (ref 3–12)
Neutrophils Relative %: 66 % (ref 43–77)
RBC: 4.86 MIL/uL (ref 3.87–5.11)

## 2012-11-07 MED ORDER — FLUCONAZOLE 150 MG PO TABS: 150.0000 mg | ORAL_TABLET | Freq: Once | ORAL | Status: DC

## 2012-11-07 MED ORDER — ESTRADIOL 1 MG PO TABS: 1.0000 mg | ORAL_TABLET | Freq: Every day | ORAL | Status: DC

## 2012-11-07 MED ORDER — METRONIDAZOLE 500 MG PO TABS: ORAL_TABLET | ORAL | Status: DC

## 2012-11-07 MED ORDER — PROGESTERONE MICRONIZED 200 MG PO CAPS: ORAL_CAPSULE | ORAL | Status: DC

## 2012-11-07 NOTE — Patient Instructions (Addendum)
Tetanus, Diphtheria, Pertussis (Tdap) Vaccine What You Need to Know WHY GET VACCINATED? Tetanus, diphtheria and pertussis can be very serious diseases, even for adolescents and adults. Tdap vaccine can protect us from these diseases. TETANUS (Lockjaw) causes painful muscle tightening and stiffness, usually all over the body.  It can lead to tightening of muscles in the head and neck so you can't open your mouth, swallow, or sometimes even breathe. Tetanus kills about 1 out of 5 people who are infected. DIPHTHERIA can cause a thick coating to form in the back of the throat.  It can lead to breathing problems, paralysis, heart failure, and death. PERTUSSIS (Whooping Cough) causes severe coughing spells, which can cause difficulty breathing, vomiting and disturbed sleep.  It can also lead to weight loss, incontinence, and rib fractures. Up to 2 in 100 adolescents and 5 in 100 adults with pertussis are hospitalized or have complications, which could include pneumonia and death. These diseases are caused by bacteria. Diphtheria and pertussis are spread from person to person through coughing or sneezing. Tetanus enters the body through cuts, scratches, or wounds. Before vaccines, the [place] saw as many as 200,000 cases a year of diphtheria and pertussis, and hundreds of cases of tetanus. Since vaccination began, tetanus and diphtheria have dropped by about 99% and pertussis by about 80%. TDAP VACCINE Tdap vaccine can protect adolescents and adults from tetanus, diphtheria, and pertussis. One dose of Tdap is routinely given at age 11 or 12. People who did not get Tdap at that age should get it as soon as possible. Tdap is especially important for health care professionals and anyone having close contact with a baby younger than 12 months. Pregnant women should get a dose of Tdap during every pregnancy, to protect the newborn from pertussis. Infants are most at risk for severe, life-threatening  complications from pertussis. A similar vaccine, called Td, protects from tetanus and diphtheria, but not pertussis. A Td booster should be given every 10 years. Tdap may be given as one of these boosters if you have not already gotten a dose. Tdap may also be given after a severe cut or burn to prevent tetanus infection. Your doctor can give you more information. Tdap may safely be given at the same time as other vaccines. SOME PEOPLE SHOULD NOT GET THIS VACCINE  If you ever had a life-threatening allergic reaction after a dose of any tetanus, diphtheria, or pertussis containing vaccine, OR if you have a severe allergy to any part of this vaccine, you should not get Tdap. Tell your doctor if you have any severe allergies.  If you had a coma, or long or multiple seizures within 7 days after a childhood dose of DTP or DTaP, you should not get Tdap, unless a cause other than the vaccine was found. You can still get Td.  Talk to your doctor if you:  have epilepsy or another nervous system problem,  had severe pain or swelling after any vaccine containing diphtheria, tetanus or pertussis,  ever had Guillain-Barr Syndrome (GBS),  aren't feeling well on the day the shot is scheduled. RISKS OF A VACCINE REACTION With any medicine, including vaccines, there is a chance of side effects. These are usually mild and go away on their own, but serious reactions are also possible. Brief fainting spells can follow a vaccination, leading to injuries from falling. Sitting or lying down for about 15 minutes can help prevent these. Tell your doctor if you feel dizzy or light-headed, or   have vision changes or ringing in the ears. Mild problems following Tdap (Did not interfere with activities)  Pain where the shot was given (about 3 in 4 adolescents or 2 in 3 adults)  Redness or swelling where the shot was given (about 1 person in 5)  Mild fever of at least 100.62F (up to about 1 in 25 adolescents or 1 in  100 adults)  Headache (about 3 or 4 people in 10)  Tiredness (about 1 person in 3 or 4)  Nausea, vomiting, diarrhea, stomach ache (up to 1 in 4 adolescents or 1 in 10 adults)  Chills, body aches, sore joints, rash, swollen glands (uncommon) Moderate problems following Tdap (Interfered with activities, but did not require medical attention)  Pain where the shot was given (about 1 in 5 adolescents or 1 in 100 adults)  Redness or swelling where the shot was given (up to about 1 in 16 adolescents or 1 in 25 adults)  Fever over 102F (about 1 in 100 adolescents or 1 in 250 adults)  Headache (about 3 in 20 adolescents or 1 in 10 adults)  Nausea, vomiting, diarrhea, stomach ache (up to 1 or 3 people in 100)  Swelling of the entire arm where the shot was given (up to about 3 in 100). Severe problems following Tdap (Unable to perform usual activities, required medical attention)  Swelling, severe pain, bleeding and redness in the arm where the shot was given (rare). A severe allergic reaction could occur after any vaccine (estimated less than 1 in a million doses). WHAT IF THERE IS A SERIOUS REACTION? What should I look for?  Look for anything that concerns you, such as signs of a severe allergic reaction, very high fever, or behavior changes. Signs of a severe allergic reaction can include hives, swelling of the face and throat, difficulty breathing, a fast heartbeat, dizziness, and weakness. These would start a few minutes to a few hours after the vaccination. What should I do?  If you think it is a severe allergic reaction or other emergency that can't wait, call 9-1-1 or get the person to the nearest hospital. Otherwise, call your doctor.  Afterward, the reaction should be reported to the "Vaccine Adverse Event Reporting System" (VAERS). Your doctor might file this report, or you can do it yourself through the VAERS web site at www.vaers.LAgents.no, or by calling 1-317-156-0584. VAERS is  only for reporting reactions. They do not give medical advice.  THE NATIONAL VACCINE INJURY COMPENSATION PROGRAM The National Vaccine Injury Compensation Program (VICP) is a federal program that was created to compensate people who may have been injured by certain vaccines. Persons who believe they may have been injured by a vaccine can learn about the program and about filing a claim by calling 1-639-445-1756 or visiting the VICP website at SpiritualWord.at. HOW CAN I LEARN MORE?  Ask your doctor.  Call your local or state health department.  Contact the Centers for Disease Control and Prevention (CDC):  Call 604-792-7969 or visit CDC's website at PicCapture.uy. CDC Tdap Vaccine VIS (07/26/11) Document Released: 09/04/2011 Document Revised: 11/28/2011 Document Reviewed: 09/04/2011 [employer] Patient Information 2014 [place], [place]. Hemorrhoids Hemorrhoids are swollen veins around the rectum or anus. There are two types of hemorrhoids:   Internal hemorrhoids. These occur in the veins just inside the rectum. They may poke through to the outside and become irritated and painful.  External hemorrhoids. These occur in the veins outside the anus and can be felt as a painful swelling or  hard lump near the anus. CAUSES  Pregnancy.   Obesity.   Constipation or diarrhea.   Straining to have a bowel movement.   Sitting for long periods on the toilet.  Heavy lifting or other activity that caused you to strain.  Anal intercourse. SYMPTOMS   Pain.   Anal itching or irritation.   Rectal bleeding.   Fecal leakage.   Anal swelling.   One or more lumps around the anus.  DIAGNOSIS  Your caregiver may be able to diagnose hemorrhoids by visual examination. Other examinations or tests that may be performed include:   Examination of the rectal area with a gloved hand (digital rectal exam).   Examination of anal canal using a small tube (scope).    A blood test if you have lost a significant amount of blood.  A test to look inside the colon (sigmoidoscopy or colonoscopy). TREATMENT Most hemorrhoids can be treated at home. However, if symptoms do not seem to be getting better or if you have a lot of rectal bleeding, your caregiver may perform a procedure to help make the hemorrhoids get smaller or remove them completely. Possible treatments include:   Placing a rubber band at the base of the hemorrhoid to cut off the circulation (rubber band ligation).   Injecting a chemical to shrink the hemorrhoid (sclerotherapy).   Using a tool to burn the hemorrhoid (infrared light therapy).   Surgically removing the hemorrhoid (hemorrhoidectomy).   Stapling the hemorrhoid to block blood flow to the tissue (hemorrhoid stapling).  HOME CARE INSTRUCTIONS   Eat foods with fiber, such as whole grains, beans, nuts, fruits, and vegetables. Ask your doctor about taking products with added fiber in them (fibersupplements).  Increase fluid intake. Drink enough water and fluids to keep your urine clear or pale yellow.   Exercise regularly.   Go to the bathroom when you have the urge to have a bowel movement. Do not wait.   Avoid straining to have bowel movements.   Keep the anal area dry and clean. Use wet toilet paper or moist towelettes after a bowel movement.   Medicated creams and suppositories may be used or applied as directed.   Only take over-the-counter or prescription medicines as directed by your caregiver.   Take warm sitz baths for 15 20 minutes, 3 4 times a day to ease pain and discomfort.   Place ice packs on the hemorrhoids if they are tender and swollen. Using ice packs between sitz baths may be helpful.   Put ice in a plastic bag.   Place a towel between your skin and the bag.   Leave the ice on for 15 20 minutes, 3 4 times a day.   Do not use a donut-shaped pillow or sit on the toilet for long  periods. This increases blood pooling and pain.  SEEK MEDICAL CARE IF:  You have increasing pain and swelling that is not controlled by treatment or medicine.  You have uncontrolled bleeding.  You have difficulty or you are unable to have a bowel movement.  You have pain or inflammation outside the area of the hemorrhoids. MAKE SURE YOU:  Understand these instructions.  Will watch your condition.  Will get help right away if you are not doing well or get worse. Document Released: 03/02/2000 Document Revised: 02/20/2012 Document Reviewed: 01/08/2012 [facility] Patient Information 2014 [place], [place].     RECT[employer] 5 % apply 2-3 day as needed you can purchase over the counter works great

## 2012-11-07 NOTE — Progress Notes (Signed)
Christina Church 04-27-1955 [id number]   History:    57 y.o.  for annual gyn exam with several complaints. One of her complaints is tiredness and fatigue in sweat. It appears that she works in a very warm environment. Patient is on hormone replacement therapy consisting of estradiol 1 mg daily with the addition of Prometrium 200 mg for the first 12 days of the month and this has helped her vasomotor symptoms. She was also complaining of vaginal odor with pruritus. She is in a monogamous relationship. Review of her records indicated she had a normal colonoscopy in 2010. Patient would no prior history of abnormal Pap smears. She is overdue for mammogram the last one was in 2013 which was normal. She was weighing 158 pounds last year she down to 155 pounds. Her last bone density study 2013 was normal. Patient's currently taking calcium and vitamin D twice a day. Patient is also complaining today of anal pruritus as a result of her hemorrhoids  Past medical history,surgical history, family history and social history were all reviewed and documented in the EPIC chart.  Gynecologic History Patient's last menstrual period was 11/13/2007. Contraception: post menopausal status Last Pap: 2012. Results were: normal Last mammogram: 2013. Results were: normal  Obstetric History OB History  Gravida Para Term Preterm AB SAB TAB Ectopic Multiple Living  1 1 1       1     # Outcome Date GA Lbr Len/2nd Weight Sex Delivery Anes PTL Lv  1 TRM     F SVD  N Y       ROS: A ROS was performed and pertinent positives and negatives are included in the history.  GENERAL: No fevers or chills. HEENT: No change in vision, no earache, sore throat or sinus congestion. NECK: No Church or stiffness. CARDIOVASCULAR: No chest Church or pressure. No palpitations. PULMONARY: No shortness of breath, cough or wheeze. GASTROINTESTINAL:  Hemorrhoids and anal pruritus      GENITOURINARY: No urinary frequency, urgency, hesitancy or dysuria.  MUSCULOSKELETAL: No joint or muscle Church, no back Church, no recent trauma. DERMATOLOGIC: No rash, no itching, no lesions. ENDOCRINE: No polyuria, polydipsia, no heat or cold intolerance. No recent change in weight. HEMATOLOGICAL: No anemia or easy bruising or bleeding. NEUROLOGIC: No headache, seizures, numbness, tingling or weakness. PSYCHIATRIC: No depression, no loss of interest in normal activity or change in sleep pattern.   vulvar pruritus and vaginal odor  Exam: chaperone present  BP 128/82  Ht 5' 3.75" (1.619 m)  Wt 155 lb (70.308 kg)  BMI 26.82 kg/m2  LMP 11/13/2007  Body mass index is 26.82 kg/(m^2).  General appearance : Well developed well nourished female. No acute distress HEENT: Neck supple, trachea midline, no carotid bruits, no thyroidmegaly Lungs: Clear to auscultation, no rhonchi or wheezes, or rib retractions  Heart: Regular rate and rhythm, no murmurs or gallops Breast:Examined in sitting and supine position were symmetrical in appearance, no palpable masses or tenderness,  no skin retraction, no nipple inversion, no nipple discharge, no skin discoloration, no axillary or supraclavicular lymphadenopathy Abdomen: no palpable masses or tenderness, no rebound or guarding Extremities: no edema or skin discoloration or tenderness  Pelvic:  Bartholin, Urethra, Skene Glands: Within normal limits             Vagina: No gross lesions or discharge  Cervix: No gross lesions or discharge  Uterus  anteverted, normal size, shape and consistency, non-tender and mobile  Adnexa  Without masses or tenderness  Anus and perineum  normal   Rectovaginal  normal sphincter tone without palpated masses or tenderness,external nonthrombosed hemorrhoids             Hemoccult course provided   Wet prep: Yeast were present along with tumors to count bacteria  Assessment/Plan:  57 y.o. female for annual exam will be treated for BV and monilia lysis with the following: Flagyl 500 mg twice a day  for 5 days, Diflucan 150 mg one by mouth today. Patient was given sample of rectogenital to apply for her hemorrhoids. She was reminded to increase her fluid intake and fiber diet. Prescription refill her estradiol 1 mg by mouth daily with the addition of Prometrium 200 mg daily for 12 days of the month was provided. The patient received her Tdap vaccine today. The following labs were ordered: CBC, TSH, comprehensive metabolic panel, fasting lipid profile and vitamin D.  New CDC guidelines is recommending patients be tested once in her lifetime for hepatitis C antibody who were born between [birth year] through 1964. This was discussed with the patient today and has agreed to be tested today.  Patient was reminded to schedule her overdue mammogram. We also discussed importance of monthly breast exam. She was reminded to submit to the office the nuchal cord for testing.     Ok Edwards MD, 10:45 AM 11/07/2012

## 2012-11-08 LAB — URINALYSIS W MICROSCOPIC + REFLEX CULTURE
Casts: NONE SEEN
Crystals: NONE SEEN
Leukocytes, UA: NEGATIVE
Nitrite: NEGATIVE
Specific Gravity, Urine: 1.019 (ref 1.005–1.030)
Urobilinogen, UA: 0.2 mg/dL (ref 0.0–1.0)
pH: 6 (ref 5.0–8.0)

## 2012-11-08 LAB — HEPATITIS C ANTIBODY: HCV Ab: NEGATIVE

## 2012-11-13 ENCOUNTER — Telehealth: Payer: Self-pay

## 2012-11-13 NOTE — Telephone Encounter (Signed)
Inquiring regarding lab results from last visit. I called her back and got her voice mail. Left message labs all normal and neg.

## 2012-11-27 ENCOUNTER — Other Ambulatory Visit: Payer: Managed Care, Other (non HMO) | Admitting: Anesthesiology

## 2012-11-27 DIAGNOSIS — Z1211 Encounter for screening for malignant neoplasm of colon: Secondary | ICD-10-CM

## 2012-11-28 ENCOUNTER — Other Ambulatory Visit: Payer: Self-pay | Admitting: Gynecology

## 2013-08-13 ENCOUNTER — Other Ambulatory Visit: Payer: Self-pay

## 2013-08-13 DIAGNOSIS — Z1231 Encounter for screening mammogram for malignant neoplasm of breast: Secondary | ICD-10-CM

## 2013-10-13 ENCOUNTER — Other Ambulatory Visit: Payer: Self-pay | Admitting: Gynecology

## 2013-11-11 ENCOUNTER — Other Ambulatory Visit: Payer: Self-pay | Admitting: Gynecology

## 2013-11-19 ENCOUNTER — Ambulatory Visit (INDEPENDENT_AMBULATORY_CARE_PROVIDER_SITE_OTHER): Payer: Managed Care, Other (non HMO) | Admitting: Gynecology

## 2013-11-19 ENCOUNTER — Encounter: Payer: Self-pay | Admitting: Gynecology

## 2013-11-19 ENCOUNTER — Encounter (INDEPENDENT_AMBULATORY_CARE_PROVIDER_SITE_OTHER): Payer: Self-pay

## 2013-11-19 ENCOUNTER — Ambulatory Visit
Admission: RE | Admit: 2013-11-19 | Discharge: 2013-11-19 | Disposition: A | Discharge status: Home / Self Care | Payer: Private Health Insurance - Indemnity | Source: Ambulatory Visit

## 2013-11-19 ENCOUNTER — Other Ambulatory Visit (HOSPITAL_COMMUNITY)
Admission: RE | Admit: 2013-11-19 | Discharge: 2013-11-19 | Disposition: A | Discharge status: Home / Self Care | Payer: Private Health Insurance - Indemnity | Source: Ambulatory Visit | Attending: Gynecology | Admitting: Gynecology

## 2013-11-19 VITALS — BP 124/80 | Ht 63.5 in | Wt 162.0 lb

## 2013-11-19 DIAGNOSIS — Z1151 Encounter for screening for human papillomavirus (HPV): Secondary | ICD-10-CM | POA: Insufficient documentation

## 2013-11-19 DIAGNOSIS — Z01419 Encounter for gynecological examination (general) (routine) without abnormal findings: Secondary | ICD-10-CM | POA: Insufficient documentation

## 2013-11-19 DIAGNOSIS — N898 Other specified noninflammatory disorders of vagina: Secondary | ICD-10-CM

## 2013-11-19 DIAGNOSIS — Z79890 Hormone replacement therapy (postmenopausal): Secondary | ICD-10-CM

## 2013-11-19 DIAGNOSIS — Z1231 Encounter for screening mammogram for malignant neoplasm of breast: Secondary | ICD-10-CM

## 2013-11-19 LAB — WET PREP FOR TRICH, YEAST, CLUE
CLUE CELLS WET PREP: NONE SEEN
Trich, Wet Prep: NONE SEEN
WBC, Wet Prep HPF POC: NONE SEEN
Yeast Wet Prep HPF POC: NONE SEEN

## 2013-11-19 MED ORDER — ESTRADIOL 1 MG PO TABS: 1.0000 mg | ORAL_TABLET | Freq: Every day | ORAL | Status: DC

## 2013-11-19 MED ORDER — PROGESTERONE MICRONIZED 200 MG PO CAPS: ORAL_CAPSULE | ORAL | Status: DC

## 2013-11-19 NOTE — Progress Notes (Addendum)
Christina Church 12-25-1955 [id number]   History:    58 y.o.  for annual gyn exam today. Patient with no complaints today. She is doing well with her HRT consisting of estradiol 1 mg daily with the addition of Prometrium 200 mg for 12 days of the month. She initiated hormone replacement therapy when she went into the menopause at the age of 79. Patient denies any vaginal bleeding. She was having some mild vaginal odor but no true discharge and some slight for rise. She is sexually active with her husband. She had her mammogram today results pending. Her last bone density study was normal in 2013. Patient with no previous abnormal Pap smear. She had a normal colonoscopy in 2010. Her brother had a history of benign colon polyps. Patient is only 10 years cycle. Patient's PCP is Dr. Arlyce Dice  Past medical history,surgical history, family history and social history were all reviewed and documented in the EPIC chart.  Gynecologic History Patient's last menstrual period was 11/13/2007. Contraception: post menopausal status Last Pap: 2012. Results were: normal Last mammogram: Today. Results were: Results pending  Obstetric History OB History  Gravida Para Term Preterm AB SAB TAB Ectopic Multiple Living  # Outcome Date GA Lbr Len/2nd Weight Sex Delivery Anes PTL Lv  1 TRM     F SVD  N Y       ROS: A ROS was performed and pertinent positives and negatives are included in the history.  GENERAL: No fevers or chills. HEENT: No change in vision, no earache, sore throat or sinus congestion. NECK: No pain or stiffness. CARDIOVASCULAR: No chest pain or pressure. No palpitations. PULMONARY: No shortness of breath, cough or wheeze. GASTROINTESTINAL: No abdominal pain, nausea, vomiting or diarrhea, melena or bright red blood per rectum. GENITOURINARY: No urinary frequency, urgency, hesitancy or dysuria. MUSCULOSKELETAL: No joint or muscle pain, no back pain, no recent trauma. DERMATOLOGIC: No  rash, no itching, no lesions. ENDOCRINE: No polyuria, polydipsia, no heat or cold intolerance. No recent change in weight. HEMATOLOGICAL: No anemia or easy bruising or bleeding. NEUROLOGIC: No headache, seizures, numbness, tingling or weakness. PSYCHIATRIC: No depression, no loss of interest in normal activity or change in sleep pattern.     Exam: chaperone present  BP 124/80  Ht 5' 3.5" (1.613 m)  Wt 162 lb (73.483 kg)  BMI 28.24 kg/m2  LMP 11/13/2007  Body mass index is 28.24 kg/(m^2).  General appearance : Well developed well nourished female. No acute distress HEENT: Neck supple, trachea midline, no carotid bruits, no thyroidmegaly Lungs: Clear to auscultation, no rhonchi or wheezes, or rib retractions  Heart: Regular rate and rhythm, no murmurs or gallops Breast:Examined in sitting and supine position were symmetrical in appearance, no palpable masses or tenderness,  no skin retraction, no nipple inversion, no nipple discharge, no skin discoloration, no axillary or supraclavicular lymphadenopathy Abdomen: no palpable masses or tenderness, no rebound or guarding Extremities: no edema or skin discoloration or tenderness  Pelvic:  Bartholin, Urethra, Skene Glands: Within normal limits             Vagina: No gross lesions or discharge, atrophic changes  Cervix: No gross lesions or discharge  Uterus  anteverted, normal size, shape and consistency, non-tender and mobile  Adnexa  Without masses or tenderness  Anus and perineum  normal   Rectovaginal  normal sphincter tone without palpated masses or tenderness  Hemoccult PCP provides  Wet prep negative   Assessment/Plan:  58 y.o. female for annual exam doing well on her hormone replacement therapy reports no vaginal bleeding. We discussed that in 5 years we need to begin to taper her off the HRT to minimize her risk of breast cancer. Pap smear was done today. She will schedule her bone density study here in the office in the  next few weeks. She had her mammogram today 3D as a result of dense breast results pending at time of this dictation. She was reminded to do her breast exams monthly. We also discussed importance of calcium and vitamin D and regular exercise for osteoporosis prevention. Blood work to be done by PCP. Patient declined flu vaccine.  Note: This dictation was prepared with  Dragon/digital dictation along withSmart phrase technology. Any transcriptional errors that result from this process are unintentional.   Ok Edwards MD, 4:55 PM 11/19/2013

## 2013-11-19 NOTE — Patient Instructions (Signed)

## 2013-11-24 LAB — CYTOLOGY - PAP

## 2013-12-03 ENCOUNTER — Other Ambulatory Visit: Payer: Self-pay | Admitting: Gynecology

## 2013-12-03 ENCOUNTER — Ambulatory Visit (INDEPENDENT_AMBULATORY_CARE_PROVIDER_SITE_OTHER): Payer: Managed Care, Other (non HMO)

## 2013-12-03 DIAGNOSIS — Z13820 Encounter for screening for osteoporosis: Secondary | ICD-10-CM

## 2013-12-03 DIAGNOSIS — Z79890 Hormone replacement therapy (postmenopausal): Secondary | ICD-10-CM

## 2014-01-18 ENCOUNTER — Encounter: Payer: Self-pay | Admitting: Gynecology

## 2014-10-28 ENCOUNTER — Other Ambulatory Visit: Payer: Self-pay

## 2014-10-28 DIAGNOSIS — Z1231 Encounter for screening mammogram for malignant neoplasm of breast: Secondary | ICD-10-CM

## 2014-11-19 ENCOUNTER — Encounter: Payer: Managed Care, Other (non HMO) | Admitting: Gynecology

## 2014-11-26 ENCOUNTER — Other Ambulatory Visit: Payer: Self-pay | Admitting: Gynecology

## 2014-11-26 ENCOUNTER — Encounter: Payer: Managed Care, Other (non HMO) | Admitting: Gynecology

## 2014-12-10 ENCOUNTER — Other Ambulatory Visit: Payer: Self-pay | Admitting: Gynecology

## 2014-12-10 ENCOUNTER — Ambulatory Visit: Payer: Private Health Insurance - Indemnity

## 2014-12-17 ENCOUNTER — Encounter: Payer: Self-pay | Admitting: Gynecology

## 2014-12-17 ENCOUNTER — Ambulatory Visit
Admission: RE | Admit: 2014-12-17 | Discharge: 2014-12-17 | Disposition: A | Discharge status: Home / Self Care | Payer: Managed Care, Other (non HMO) | Source: Ambulatory Visit

## 2014-12-17 ENCOUNTER — Ambulatory Visit (INDEPENDENT_AMBULATORY_CARE_PROVIDER_SITE_OTHER): Payer: Managed Care, Other (non HMO) | Admitting: Gynecology

## 2014-12-17 VITALS — BP 124/70 | Ht 63.5 in | Wt 158.0 lb

## 2014-12-17 DIAGNOSIS — F4321 Adjustment disorder with depressed mood: Secondary | ICD-10-CM | POA: Diagnosis not present

## 2014-12-17 DIAGNOSIS — Z79890 Hormone replacement therapy (postmenopausal): Secondary | ICD-10-CM | POA: Diagnosis not present

## 2014-12-17 DIAGNOSIS — Z01419 Encounter for gynecological examination (general) (routine) without abnormal findings: Secondary | ICD-10-CM | POA: Diagnosis not present

## 2014-12-17 DIAGNOSIS — Z1231 Encounter for screening mammogram for malignant neoplasm of breast: Secondary | ICD-10-CM

## 2014-12-17 MED ORDER — EST ESTROGENS-METHYLTEST 0.625-1.25 MG PO TABS: 1.0000 | ORAL_TABLET | Freq: Every day | ORAL | Status: DC

## 2014-12-17 MED ORDER — PROGESTERONE MICRONIZED 200 MG PO CAPS: ORAL_CAPSULE | ORAL | Status: DC

## 2014-12-17 MED ORDER — PAROXETINE HCL ER 12.5 MG PO TB24: 12.5000 mg | ORAL_TABLET | ORAL | Status: DC

## 2014-12-17 NOTE — Patient Instructions (Addendum)
Esterified Estrogens; Methyltestosterone tablets What is this medicine? ESTERIFIED ESTROGENS; METHYLTESTOSTERONE (es TAIR i fyed ES troe jenz; meth il tes TOS te rone) is a combination of hormones. This medicine is used to treat some of the symptoms of menopause like hot flashes and vaginal dryness. This medicine may be used for other purposes; ask your health care provider or pharmacist if you have questions. COMMON BRAND NAME(S): Covaryx, Covaryx H.S., EEMT, EEMT HS, Essian, Essian HS, Estratest, Estratest HS, Syntest DS, Syntest HS What should I tell my health care provider before I take this medicine? They need to know if you have any of these conditions: -abnormal vaginal bleeding -blood vessel disease or blood clots -breast, cervical, endometrial, ovarian, liver, or uterine cancer -dementia -diabetes -gallbladder disease -heart disease or recent heart attack -high blood pressure -high cholesterol -high level of calcium in the blood -hysterectomy -kidney disease -liver disease -migraine headaches -stroke -systemic lupus erythematosus (SLE) -tobacco smoker -vaginal bleeding -an unusual or allergic reaction to estrogens, other hormones, medicines, foods, dyes, or preservatives -pregnant or trying to get pregnant -breast-feeding How should I use this medicine? Take this medicine by mouth with a glass of water. To reduce nausea, this medicine may be taken with food. Follow the directions on the prescription label. Take this medicine at the same time each day. Do not take your medicine more often than directed. Talk to your pediatrician regarding the use of this medicine in children. Special care may be needed. A patient package insert for the product will be given with each prescription and refill. Read this sheet carefully each time. The sheet may change frequently. Overdosage: If you think you have taken too much of this medicine contact a poison control center or emergency room at  once. NOTE: This medicine is only for you. Do not share this medicine with others. What if I miss a dose? If you miss a dose, take it as soon as you can. If it is almost time for your next dose, take only that dose. Do not take double or extra doses. What may interact with this medicine? Do not take this medicine with any of the following medications: -medicines for cancer like aminoglutethimide, anastrozole, exemestane, letrozole, testolactone, vorozole This medicine may also interact with the following medications: -antibiotics like erythromycin, clarithromycin -carbamazepine -female hormones, like estrogens or progestins and birth control pills -grapefruit juice -herbal remedies for menopause or female problems -insulin -itraconazole -ketoconazole -medicines that treat or prevent blood clots like warfarin -oxyphenbutazone -phenobarbital -rifampin -ritonavir -St. John's Wort This list may not describe all possible interactions. Give your health care provider a list of all the medicines, herbs, non-prescription drugs, or dietary supplements you use. Also tell them if you smoke, drink alcohol, or use illegal drugs. Some items may interact with your medicine. What should I watch for while using this medicine? Visit your doctor or health care professional for regular checks on your progress. You will need a regular breast and pelvic exam and Pap smear while on this medicine. You should also discuss the need for regular mammograms with your health care professional, and follow his or her guidelines for these tests. This medicine can make your body retain fluid, making your fingers, hands, or ankles swell. Your blood pressure can go up. Contact your doctor or health care professional if you feel you are retaining fluid. If you have any reason to think you are pregnant, stop taking this medicine right away and contact your doctor or health care professional. Smoking  increases the risk of  getting a blood clot or having a stroke while you are taking this medicine, especially if you are more than 58 years old. You are strongly advised not to smoke. If you wear contact lenses and notice visual changes, or if the lenses begin to feel uncomfortable, consult your eye doctor or health care professional. This medicine can increase the risk of developing a condition (endometrial hyperplasia) that may lead to cancer of the lining of the uterus. Taking progestins, another hormone drug, with this medicine lowers the risk of developing this condition. Therefore, if your uterus has not been removed (by a hysterectomy), your doctor may prescribe a progestin for you to take together with your estrogen. You should know, however, that taking estrogens with progestins may have additional health risks. You should discuss the use of estrogens and progestins with your health care professional to determine the benefits and risks for you. If you are going to have surgery, you may need to stop taking this medicine. Consult your health care professional for advice before you schedule the surgery. What side effects may I notice from receiving this medicine? Side effects that you should report to your doctor or health care professional as soon as possible: -allergic reactions like skin rash, itching or hives, swelling of the face, lips, or tongue -breast tissue changes or discharge -changes in vision -chest pain -confusion, trouble speaking or understanding -dark urine -general ill feeling or flu-like symptoms -light-colored stools -nausea, vomiting -pain, swelling, warmth in the leg -right upper belly pain -severe headaches -shortness of breath -sudden numbness or weakness of the face, arm or leg -trouble walking, dizziness, loss of balance or coordination -unusual vaginal bleeding -yellowing of the eyes or skin Side effects that usually do not require medical attention (report to your doctor or health  care professional if they continue or are bothersome): -hair loss -increased hunger or thirst -increased urination -symptoms of vaginal infection like itching, irritation or unusual discharge -unusually weak or tired This list may not describe all possible side effects. Call your doctor for medical advice about side effects. You may report side effects to FDA at 1-800-FDA-1088. Where should I keep my medicine? Keep out of the reach of children. Store at room temperature between 15 and 30 degrees C (59 and 86 degrees F). Throw away any unused medicine after the expiration date. NOTE: This sheet is a summary. It may not cover all possible information. If you have questions about this medicine, talk to your doctor, pharmacist, or health care provider.  2015, Elsevier/Gold Standard. (2008-02-19 11:46:40) Paroxetine Controlled-Release Tablets What is this medicine? PAROXETINE (pa ROX e teen) is used to treat depression. It may also be used to treat anxiety disorders, obsessive compulsive disorder, panic attacks, post traumatic stress, and premenstrual dysphoric disorder (PMDD). This medicine may be used for other purposes; ask your health care provider or pharmacist if you have questions. COMMON BRAND NAME(S): Paxil CR What should I tell my health care provider before I take this medicine? They need to know if you have any of these conditions: -bleeding disorders -glaucoma -heart disease -kidney disease -liver disease -low levels of sodium in the blood -mania or bipolar disorder -seizures -suicidal thoughts, plans, or attempt; a previous suicide attempt by you or a family member -take MAOIs like Carbex, Eldepryl, Marplan, Nardil, and Parnate -take medicines that treat or prevent blood clots -an unusual or allergic reaction to paroxetine, other medicines, foods, dyes, or preservatives -pregnant or trying to get  pregnant -breast-feeding How should I use this medicine? Take this medicine  by mouth with a glass of water. Follow the directions on the prescription label. You can take it with or without food. Do not crush or chew this medicine. Take your medicine at regular intervals. Do not take your medicine more often than directed. Do not stop taking this medicine suddenly except upon the advice of your doctor. Stopping this medicine too quickly may cause serious side effects or your condition may worsen. A special MedGuide will be given to you by the pharmacist with each prescription and refill. Be sure to read this information carefully each time. Talk to your pediatrician regarding the use of this medicine in children. Special care may be needed. Overdosage: If you think you have taken too much of this medicine contact a poison control center or emergency room at once. NOTE: This medicine is only for you. Do not share this medicine with others. What if I miss a dose? If you miss a dose, take it as soon as you can. If it is almost time for your next dose, take only that dose. Do not take double or extra doses. What may interact with this medicine? Do not take this medicine with any of the following medications: -linezolid -MAOIs like Carbex, Eldepryl, Marplan, Nardil, and Parnate -methylene blue (injected into a vein) -pimozide -thioridazine This medicine may also interact with the following medications: -alcohol -antacids -aspirin and aspirin-like medicines -atomoxetine -certain medicines for depression, anxiety, or psychotic disturbances -certain medicines for irregular heart beat like propafenone, flecainide, encainide, and quinidine -certain medicines for migraine headache like almotriptan, eletriptan, frovatriptan, naratriptan, rizatriptan, sumatriptan, zolmitriptan -cimetidine -digoxin -diuretics -fentanyl -fosamprenavir/ritonavir -furazolidone -isoniazid -lithium -medicines that treat or prevent blood clots like warfarin, enoxaparin, and dalteparin -medicines  for sleep -metoprolol -NSAIDs, medicines for pain and inflammation, like ibuprofen or naproxen -phenobarbital -phenytoin -procarbazine -procyclidine -rasagiline -supplements like St. John's wort, kava kava, valerian -tamoxifen -theophylline -tramadol -tryptophan This list may not describe all possible interactions. Give your health care provider a list of all the medicines, herbs, non-prescription drugs, or dietary supplements you use. Also tell them if you smoke, drink alcohol, or use illegal drugs. Some items may interact with your medicine. What should I watch for while using this medicine? Tell your doctor if your symptoms do not get better or if they get worse. Visit your doctor or health care professional for regular checks on your progress. Because it may take several weeks to see the full effects of this medicine, it is important to continue your treatment as prescribed by your doctor. Patients and their families should watch out for new or worsening thoughts of suicide or depression. Also watch out for sudden changes in feelings such as feeling anxious, agitated, panicky, irritable, hostile, aggressive, impulsive, severely restless, overly excited and hyperactive, or not being able to sleep. If this happens, especially at the beginning of treatment or after a change in dose, call your health care professional. Bonita Quin may get drowsy or dizzy. Do not drive, use machinery, or do anything that needs mental alertness until you know how this medicine affects you. Do not stand or sit up quickly, especially if you are an older patient. This reduces the risk of dizzy or fainting spells. Alcohol may interfere with the effect of this medicine. Avoid alcoholic drinks. Your mouth may get dry. Chewing sugarless gum or sucking hard candy, and drinking plenty of water will help. Contact your doctor if the problem does not go  away or is severe. What side effects may I notice from receiving this  medicine? Side effects that you should report to your doctor or health care professional as soon as possible: -allergic reactions like skin rash, itching or hives, swelling of the face, lips, or tongue -black or bloody stools, blood in the urine or vomit -fast, irregular heartbeat -hallucination, loss of contact with reality -painful or prolonged erection (men) -seizures -suicidal thoughts or other mood changes -trouble passing urine or change in the amount of urine -unusual bleeding or bruising -unusually weak or tired -vomiting Side effects that usually do not require medical attention (report to your doctor or health care professional if they continue or are bothersome): -change in appetite, weight -change in sex drive or performance -constipation or diarrhea -difficulty sleeping -drowsy -headache -increased sweating -muscle pain or weakness -tremors This list may not describe all possible side effects. Call your doctor for medical advice about side effects. You may report side effects to FDA at 1-800-FDA-1088. Where should I keep my medicine? Keep out of the reach of children. Store at or below 25 degrees C (77 degrees F). Throw away any unused medicine after the expiration date. NOTE: This sheet is a summary. It may not cover all possible information. If you have questions about this medicine, talk to your doctor, pharmacist, or health care provider.  2015, Elsevier/Gold Standard. (2011-10-18 17:51:56)

## 2014-12-17 NOTE — Progress Notes (Signed)
Christina Church [birth year] [id number]   History:    59 y.o.  for annual gyn exam with complaints of at times feeling anxious and somewhat depressed. Also her libido at times. Be decreased. Patient has been on HRT consisting of estradiol 1 mg daily with the addition of Prometrium 200 mg for 12 days of the month which is help with her vasomotor symptoms this was initiated which was 59 years of age. Patient denies any vaginal bleeding. She scheduled for mammogram later today. She had a normal bone density study in 2015. Patient with no past history of any abnormal Pap smears. She had a normal colonoscopy in 2010. Her brother had a history of benign colon polyps and she states that she is on a 10 year recall. Her PCP did her blood work in March of this year.  Past medical history,surgical history, family history and social history were all reviewed and documented in the EPIC chart.  Gynecologic History Patient's last menstrual period was 11/13/2007. Contraception: post menopausal status Last Pap: 2015. Results were: normal Last mammogram: 2015. Results were: normal  Obstetric History OB History  Gravida Para Term Preterm AB SAB TAB Ectopic Multiple Living  # Outcome Date GA Lbr Len/2nd Weight Sex Delivery Anes PTL Lv  1 Term     F Vag-Spont  N Y       ROS: A ROS was performed and pertinent positives and negatives are included in the history.  GENERAL: No fevers or chills. HEENT: No change in vision, no earache, sore throat or sinus congestion. NECK: No pain or stiffness. CARDIOVASCULAR: No chest pain or pressure. No palpitations. PULMONARY: No shortness of breath, cough or wheeze. GASTROINTESTINAL: No abdominal pain, nausea, vomiting or diarrhea, melena or bright red blood per rectum. GENITOURINARY: No urinary frequency, urgency, hesitancy or dysuria. MUSCULOSKELETAL: No joint or muscle pain, no back pain, no recent trauma. DERMATOLOGIC: No rash, no itching, no lesions.  ENDOCRINE: No polyuria, polydipsia, no heat or cold intolerance. No recent change in weight. HEMATOLOGICAL: No anemia or easy bruising or bleeding. NEUROLOGIC: No headache, seizures, numbness, tingling or weakness. PSYCHIATRIC: No depression, no loss of interest in normal activity or change in sleep pattern.     Exam: chaperone present  BP 124/70 mmHg  Ht 5' 3.5" (1.613 m)  Wt 158 lb (71.668 kg)  BMI 27.55 kg/m2  LMP 11/13/2007  Body mass index is 27.55 kg/(m^2).  General appearance : Well developed well nourished female. No acute distress HEENT: Eyes: no retinal hemorrhage or exudates,  Neck supple, trachea midline, no carotid bruits, no thyroidmegaly Lungs: Clear to auscultation, no rhonchi or wheezes, or rib retractions  Heart: Regular rate and rhythm, no murmurs or gallops Breast:Examined in sitting and supine position were symmetrical in appearance, no palpable masses or tenderness,  no skin retraction, no nipple inversion, no nipple discharge, no skin discoloration, no axillary or supraclavicular lymphadenopathy Abdomen: no palpable masses or tenderness, no rebound or guarding Extremities: no edema or skin discoloration or tenderness  Pelvic:  Bartholin, Urethra, Skene Glands: Within normal limits             Vagina: No gross lesions or discharge  Cervix: No gross lesions or discharge  Uterus  anteverted, normal size, shape and consistency, non-tender and mobile  Adnexa  Without masses or tenderness  Anus and perineum  normal   Rectovaginal  normal sphincter tone without palpated masses or  tenderness             Hemoccult cards will be provided     Assessment/Plan:  59 y.o. female for annual exam with signs and symptoms of mild depression and occasional anxiety. We discussed different treatment option. She will be started on Paxil CR 12.5 mg to take 1 by mouth daily. Risks benefits and pros and cons were discussed and literature information was provided. Because her libido  issues were going to change her estrogen from Estrace 1 mg daily to Estratest half strength 0.625 mg to take 1 daily with the addition of Prometrium 200 mg for 12 days of the month. Patient due for mammogram later today. We discussed importance of monthly breast exam. Discussed importance of calcium vitamin D and regular exercise for osteoporosis prevention. Her bone density study will be due next year. Pap smear not indicated this year. Patient to get her flu vaccine next month.   Ok Edwards MD, 10:42 AM 12/17/2014

## 2014-12-21 ENCOUNTER — Other Ambulatory Visit: Payer: Self-pay | Admitting: Gynecology

## 2014-12-21 DIAGNOSIS — R928 Other abnormal and inconclusive findings on diagnostic imaging of breast: Secondary | ICD-10-CM

## 2014-12-31 ENCOUNTER — Ambulatory Visit
Admission: RE | Admit: 2014-12-31 | Discharge: 2014-12-31 | Disposition: A | Discharge status: Home / Self Care | Payer: Managed Care, Other (non HMO) | Source: Ambulatory Visit | Attending: Gynecology | Admitting: Gynecology

## 2014-12-31 DIAGNOSIS — R928 Other abnormal and inconclusive findings on diagnostic imaging of breast: Secondary | ICD-10-CM

## 2015-01-18 ENCOUNTER — Telehealth: Payer: Self-pay | Admitting: *Deleted

## 2015-01-18 NOTE — Telephone Encounter (Signed)
Pt left message in triage c/o her HRT is too expensive, I left message for pt to call me back.

## 2015-01-20 MED ORDER — ESTRADIOL 1 MG PO TABS: 1.0000 mg | ORAL_TABLET | Freq: Every day | ORAL | Status: DC

## 2015-01-20 NOTE — Telephone Encounter (Signed)
D/C Estratest. Start Estrace 1 mg PO Q Daily #30) refill x 11)and continue Prmetrium 200 mg 12 days of the month as before

## 2015-01-20 NOTE — Telephone Encounter (Addendum)
Estratest working great. However 30 day supply is $130 and she cannot afford that every mos.   She checked with her ins co regarding something more cost efficient and all they could suggest was Estradiol 0.5 mg tabs.  Patient asked if she could just go back on what she was on prior to change to [place].

## 2015-01-20 NOTE — Telephone Encounter (Signed)
Patient informed. Rx sent 

## 2015-01-25 ENCOUNTER — Other Ambulatory Visit: Payer: Managed Care, Other (non HMO) | Admitting: Anesthesiology

## 2015-01-25 DIAGNOSIS — Z1211 Encounter for screening for malignant neoplasm of colon: Secondary | ICD-10-CM | POA: Diagnosis not present

## 2015-07-14 ENCOUNTER — Other Ambulatory Visit: Payer: Self-pay

## 2015-07-14 DIAGNOSIS — Z1231 Encounter for screening mammogram for malignant neoplasm of breast: Secondary | ICD-10-CM

## 2015-12-23 ENCOUNTER — Ambulatory Visit (INDEPENDENT_AMBULATORY_CARE_PROVIDER_SITE_OTHER): Payer: Managed Care, Other (non HMO) | Admitting: Gynecology

## 2015-12-23 ENCOUNTER — Encounter: Payer: Self-pay | Admitting: Gynecology

## 2015-12-23 ENCOUNTER — Ambulatory Visit
Admission: RE | Admit: 2015-12-23 | Discharge: 2015-12-23 | Disposition: A | Discharge status: Home / Self Care | Payer: Managed Care, Other (non HMO) | Source: Ambulatory Visit

## 2015-12-23 VITALS — BP 118/76 | Ht 63.5 in | Wt 160.0 lb

## 2015-12-23 DIAGNOSIS — Z01419 Encounter for gynecological examination (general) (routine) without abnormal findings: Secondary | ICD-10-CM

## 2015-12-23 DIAGNOSIS — Z23 Encounter for immunization: Secondary | ICD-10-CM | POA: Diagnosis not present

## 2015-12-23 DIAGNOSIS — Z1231 Encounter for screening mammogram for malignant neoplasm of breast: Secondary | ICD-10-CM

## 2015-12-23 DIAGNOSIS — Z780 Asymptomatic menopausal state: Secondary | ICD-10-CM | POA: Diagnosis not present

## 2015-12-23 DIAGNOSIS — Z79890 Hormone replacement therapy (postmenopausal): Secondary | ICD-10-CM

## 2015-12-23 MED ORDER — ESTRADIOL 1 MG PO TABS: 1.0000 mg | ORAL_TABLET | Freq: Every day | ORAL | 4 refills | Status: DC

## 2015-12-23 MED ORDER — PAROXETINE HCL ER 12.5 MG PO TB24: 12.5000 mg | ORAL_TABLET | ORAL | 11 refills | Status: DC

## 2015-12-23 MED ORDER — PROGESTERONE MICRONIZED 200 MG PO CAPS: ORAL_CAPSULE | ORAL | 4 refills | Status: DC

## 2015-12-23 NOTE — Patient Instructions (Signed)
Bone Densitometry Bone densitometry is an imaging test that uses a special X-ray to measure the amount of calcium and other minerals in your bones (bone density). This test is also known as a bone mineral density test or dual-energy X-ray absorptiometry (DXA). The test can measure bone density at your hip and your spine. It is similar to having a regular X-ray. You may have this test to:  Diagnose a condition that causes weak or thin bones (osteoporosis).  Predict your risk of a broken bone (fracture).  Determine how well osteoporosis treatment is working. LET YOUR HEALTH CARE PROVIDER KNOW ABOUT:  Any allergies you have.  All medicines you are taking, including vitamins, herbs, eye drops, creams, and over-the-counter medicines.  Previous problems you or members of your family have had with the use of anesthetics.  Any blood disorders you have.  Previous surgeries you have had.  Medical conditions you have.  Possibility of pregnancy.  Any other medical test you had within the previous 14 days that used contrast material. RISKS AND COMPLICATIONS Generally, this is a safe procedure. However, problems can occur and may include the following:  This test exposes you to a very small amount of radiation.  The risks of radiation exposure may be greater to unborn children. BEFORE THE PROCEDURE  Do not take any calcium supplements for 24 hours before having the test. You can otherwise eat and drink what you usually do.  Take off all metal jewelry, eyeglasses, dental appliances, and any other metal objects. PROCEDURE  You may lie on an exam table. There will be an X-ray generator below you and an imaging device above you.  Other devices, such as boxes or braces, may be used to position your body properly for the scan.  You will need to lie still while the machine slowly scans your body.  The images will show up on a computer monitor. AFTER THE PROCEDURE You may need more testing  at a later time.   This information is not intended to replace advice given to you by your health care provider. Make sure you discuss any questions you have with your health care provider.   Document Released: 03/27/2004 Document Revised: 03/26/2014 Document Reviewed: 08/13/2013 Elsevier Interactive Patient Education 2016 Elsevier Inc.  Influenza Virus Vaccine (Flucelvax) What is this medicine? INFLUENZA VIRUS VACCINE (in floo EN zuh VAHY ruhs vak SEEN) helps to reduce the risk of getting influenza also known as the flu. The vaccine only helps protect you against some strains of the flu. This medicine may be used for other purposes; ask your health care provider or pharmacist if you have questions. What should I tell my health care provider before I take this medicine? They need to know if you have any of these conditions: -bleeding disorder like hemophilia -fever or infection -Guillain-Barre syndrome or other neurological problems -immune system problems -infection with the human immunodeficiency virus (HIV) or AIDS -low blood platelet counts -multiple sclerosis -an unusual or allergic reaction to influenza virus vaccine, other medicines, foods, dyes or preservatives -pregnant or trying to get pregnant -breast-feeding How should I use this medicine? This vaccine is for injection into a muscle. It is given by a health care professional. A copy of Vaccine Information Statements will be given before each vaccination. Read this sheet carefully each time. The sheet may change frequently. Talk to your pediatrician regarding the use of this medicine in children. Special care may be needed. Overdosage: If you think you've taken too much of   this medicine contact a poison control center or emergency room at once. Overdosage: If you think you have taken too much of this medicine contact a poison control center or emergency room at once. NOTE: This medicine is only for you. Do not share this  medicine with others. What if I miss a dose? This does not apply. What may interact with this medicine? -chemotherapy or radiation therapy -medicines that lower your immune system like etanercept, anakinra, infliximab, and adalimumab -medicines that treat or prevent blood clots like warfarin -phenytoin -steroid medicines like prednisone or cortisone -theophylline -vaccines This list may not describe all possible interactions. Give your health care provider a list of all the medicines, herbs, non-prescription drugs, or dietary supplements you use. Also tell them if you smoke, drink alcohol, or use illegal drugs. Some items may interact with your medicine. What should I watch for while using this medicine? Report any side effects that do not go away within 3 days to your doctor or health care professional. Call your health care provider if any unusual symptoms occur within 6 weeks of receiving this vaccine. You may still catch the flu, but the illness is not usually as bad. You cannot get the flu from the vaccine. The vaccine will not protect against colds or other illnesses that may cause fever. The vaccine is needed every year. What side effects may I notice from receiving this medicine? Side effects that you should report to your doctor or health care professional as soon as possible: -allergic reactions like skin rash, itching or hives, swelling of the face, lips, or tongue Side effects that usually do not require medical attention (Report these to your doctor or health care professional if they continue or are bothersome.): -fever -headache -muscle aches and pains -pain, tenderness, redness, or swelling at the injection site -tiredness This list may not describe all possible side effects. Call your doctor for medical advice about side effects. You may report side effects to FDA at 1-800-FDA-1088. Where should I keep my medicine? The vaccine will be given by a health care professional in  a clinic, pharmacy, doctor's office, or other health care setting. You will not be given vaccine doses to store at home. NOTE: This sheet is a summary. It may not cover all possible information. If you have questions about this medicine, talk to your doctor, pharmacist, or health care provider.    2016, Elsevier/Gold Standard. (2011-02-14 14:06:47)  

## 2015-12-23 NOTE — Addendum Note (Signed)
Addended by: Berna SpareASTILLO, Ritika Hellickson A on: 12/23/2015 09:27 AM   Modules accepted: Orders

## 2015-12-23 NOTE — Progress Notes (Signed)
Christina Church 10/10/1955 [id number]   History:    60 y.o.  for annual gyn exam who stated that several weeks ago she had a near fainting spell and felt drenched and saw her PCP who did obtain an EKG and patient in the process of being [facility]. Patient also has done well on her hormone replacement therapy consisting of estradiol 1 mg daily with the addition of Prometrium 200 mg for 12 days of the month. Patient denies any vaginal bleeding. She is due for mammogram today. She had a normal bone density study in 2017. Patient with no past history of any abnormal Pap smear. She had a normal colonoscopy in 2010. Her brother had a history of benign colon polyps and she is on a 10 year recall for her colonoscopy. Her PCP has been doing her blood work. She would like to have the flu vaccine today.  Past medical history,surgical history, family history and social history were all reviewed and documented in the EPIC chart.  Gynecologic History Patient's last menstrual period was 11/13/2007. Contraception: post menopausal status Last Pap: 2012 and 2015. Results were: normal Last mammogram: Scheduled for today. Results were: 2016 was normal she scheduled for today  Obstetric History OB History  Gravida Para Term Preterm AB Living  1 1 1     1   SAB TAB Ectopic Multiple Live Births          1    # Outcome Date GA Lbr Len/2nd Weight Sex Delivery Anes PTL Lv  1 Term     F Vag-Spont  N LIV       ROS: A ROS was performed and pertinent positives and negatives are included in the history.  GENERAL: No fevers or chills. HEENT: No change in vision, no earache, sore throat or sinus congestion. NECK: No pain or stiffness. CARDIOVASCULAR: No chest pain or pressure. No palpitations. PULMONARY: No shortness of breath, cough or wheeze. GASTROINTESTINAL: No abdominal pain, nausea, vomiting or diarrhea, melena or bright red blood per rectum. GENITOURINARY: No  urinary frequency, urgency, hesitancy or dysuria. MUSCULOSKELETAL: No joint or muscle pain, no back pain, no recent trauma. DERMATOLOGIC: No rash, no itching, no lesions. ENDOCRINE: No polyuria, polydipsia, no heat or cold intolerance. No recent change in weight. HEMATOLOGICAL: No anemia or easy bruising or bleeding. NEUROLOGIC: No headache, seizures, numbness, tingling or weakness. PSYCHIATRIC: No depression, no loss of interest in normal activity or change in sleep pattern.     Exam: chaperone present  BP 118/76   Ht 5' 3.5" (1.613 m)   Wt 160 lb (72.6 kg)   LMP 11/13/2007   BMI 27.90 kg/m   Body mass index is 27.9 kg/m.  General appearance : Well developed well nourished female. No acute distress HEENT: Eyes: no retinal hemorrhage or exudates,  Neck supple, trachea midline, no carotid bruits, no thyroidmegaly Lungs: Clear to auscultation, no rhonchi or wheezes, or rib retractions  Heart: Regular rate and rhythm, no murmurs or gallops Breast:Examined in sitting and supine position were symmetrical in appearance, no palpable masses or tenderness,  no skin retraction, no nipple inversion, no nipple discharge, no skin discoloration, no axillary or supraclavicular lymphadenopathy Abdomen: no palpable masses or tenderness, no rebound or guarding Extremities: no edema or skin discoloration or tenderness  Pelvic:  Bartholin, Urethra, Skene Glands: Within normal limits             Vagina: No gross lesions or discharge  Cervix: No gross lesions or discharge  Uterus  anteverted, normal size, shape and consistency, non-tender and mobile  Adnexa  Without masses or tenderness  Anus and perineum  normal   Rectovaginal  normal sphincter tone without palpated masses or tenderness             Hemoccult cards will be provided     Assessment/Plan:  60 y.o. female for annual exam will follow with her cardiologist at [facility] in the next week. She will schedule a bone density  study here in the office the next few weeks and bring her fecal Hemoccult cards for testing. Her PCP has been doing her blood work. She was reminded of the importance of calcium vitamin D and weightbearing exercises for osteoporosis prevention. Pap smear not indicated this year. Patient received the flu vaccine after she was counseled.   Ok Edwards MD, 9:21 AM 12/23/2015

## 2015-12-27 ENCOUNTER — Other Ambulatory Visit: Payer: Self-pay | Admitting: Gynecology

## 2016-01-10 ENCOUNTER — Other Ambulatory Visit: Payer: Self-pay | Admitting: Gynecology

## 2016-01-19 ENCOUNTER — Ambulatory Visit (INDEPENDENT_AMBULATORY_CARE_PROVIDER_SITE_OTHER): Payer: Managed Care, Other (non HMO)

## 2016-01-19 ENCOUNTER — Other Ambulatory Visit: Payer: Self-pay | Admitting: Gynecology

## 2016-01-19 DIAGNOSIS — Z13820 Encounter for screening for osteoporosis: Secondary | ICD-10-CM

## 2016-01-19 DIAGNOSIS — Z780 Asymptomatic menopausal state: Secondary | ICD-10-CM

## 2016-01-24 ENCOUNTER — Other Ambulatory Visit: Payer: Self-pay | Admitting: Anesthesiology

## 2016-01-24 DIAGNOSIS — Z1211 Encounter for screening for malignant neoplasm of colon: Secondary | ICD-10-CM

## 2016-08-01 ENCOUNTER — Encounter: Payer: Self-pay | Admitting: Gynecology

## 2016-09-28 ENCOUNTER — Other Ambulatory Visit: Payer: Self-pay | Admitting: Gynecology

## 2016-09-28 DIAGNOSIS — Z1231 Encounter for screening mammogram for malignant neoplasm of breast: Secondary | ICD-10-CM

## 2016-12-28 ENCOUNTER — Encounter: Payer: Self-pay | Admitting: Gynecology

## 2016-12-28 ENCOUNTER — Ambulatory Visit (INDEPENDENT_AMBULATORY_CARE_PROVIDER_SITE_OTHER): Payer: 59 | Admitting: Gynecology

## 2016-12-28 ENCOUNTER — Ambulatory Visit
Admission: RE | Admit: 2016-12-28 | Discharge: 2016-12-28 | Disposition: A | Discharge status: Home / Self Care | Payer: 59 | Source: Ambulatory Visit | Attending: Gynecology | Admitting: Gynecology

## 2016-12-28 VITALS — BP 124/78 | Ht 64.0 in | Wt 159.0 lb

## 2016-12-28 DIAGNOSIS — Z79890 Hormone replacement therapy (postmenopausal): Secondary | ICD-10-CM | POA: Diagnosis not present

## 2016-12-28 DIAGNOSIS — Z1231 Encounter for screening mammogram for malignant neoplasm of breast: Secondary | ICD-10-CM

## 2016-12-28 DIAGNOSIS — N952 Postmenopausal atrophic vaginitis: Secondary | ICD-10-CM

## 2016-12-28 DIAGNOSIS — Z23 Encounter for immunization: Secondary | ICD-10-CM

## 2016-12-28 DIAGNOSIS — Z01411 Encounter for gynecological examination (general) (routine) with abnormal findings: Secondary | ICD-10-CM | POA: Diagnosis not present

## 2016-12-28 MED ORDER — PAROXETINE HCL ER 12.5 MG PO TB24: 12.5000 mg | ORAL_TABLET | ORAL | 11 refills | Status: DC

## 2016-12-28 MED ORDER — PROGESTERONE MICRONIZED 200 MG PO CAPS: ORAL_CAPSULE | ORAL | 4 refills | Status: DC

## 2016-12-28 MED ORDER — ESTRADIOL 1 MG PO TABS: 1.0000 mg | ORAL_TABLET | Freq: Every day | ORAL | 4 refills | Status: DC

## 2016-12-28 NOTE — Progress Notes (Signed)
    Christina Church 1955-12-23 [id number]        61 y.o.  G1P1001 for annual gynecologic exam.  Former patient of Dr. Lily Peer  Past medical history,surgical history, problem list, medications, allergies, family history and social history were all reviewed and documented as reviewed in the EPIC chart.  ROS:  Performed with pertinent positives and negatives included in the history, assessment and plan.   Additional significant findings :  none   Exam: Kennon Portela assistant Vitals:   12/28/16 0853  BP: 124/78  Weight: 159 lb (72.1 kg)  Height:  (1.626 m)   Body mass index is 27.29 kg/m.  General appearance:  Normal affect, orientation and appearance. Skin: Grossly normal HEENT: Without gross lesions.  No cervical or supraclavicular adenopathy. Thyroid normal.  Lungs:  Clear without wheezing, rales or rhonchi Cardiac: RR, without RMG Abdominal:  Soft, nontender, without masses, guarding, rebound, organomegaly or hernia Breasts:  Examined lying and sitting without masses, retractions, discharge or axillary adenopathy. Pelvic:  Ext, BUS, Vagina: with atrophic changes  Cervix: with atrophic changes  Uterus: anteverted, normal size, shape and contour, midline and mobile nontender   Adnexa: Without masses or tenderness    Anus and perineum: Normal   Rectovaginal: Normal sphincter tone without palpated masses or tenderness.    Assessment/Plan:  61 y.o. G72P1001 female for annual gynecologic exam.   1. Postmenopausal/atrophic genital changes/HRT. Patient on Estrace 1 mg daily and Prometrium 200 mg for the first 12 days of each month. Has no withdrawal bleeding. Has been on HRT for a number of years. Feels well taking this.  I reviewed the most current data on HRT to include the risks such as stroke heart attack DVT and breast cancer. Benefits as far as symptom relief, cardiovascular bone health also reviewed. At this point patient wants to continue and I refilled both 1 year. I did  review options to switch her Prometrium 200 mg daily but she prefers to keep doing what she is doing. She knows to report any bleeding. 2. Paxil CR 12.5. Initiated by Dr. Lily Peer due to perimenopausal mood swings. Doing well with this. I did recommend she try weaning this coming year to see how she does now that she is further away from the menopause and on HRT. Refill 1 year provided in the event that she does not tolerate this. 3. Mammography scheduled today. Breast exam normal today. 4. DEXA 2017 normal. Patient has been having normal Dexa's every several years. Recommend repeat DEXA at 5 year interval.  Increased calcium vitamin D. 5. Colonoscopy 2010. Repeat at their recommended interval. 6. Pap smear/HPV 2015. No Pap smear done today. No history of significant abnormal Pap smears. Plan repeat Pap smear at 5 year interval per current screening guidelines. 7. Health maintenance. No routine lab work done as patient does this elsewhere. Follow up in one year, sooner as needed.   Dara Lords MD, 9:18 AM 12/28/2016

## 2016-12-28 NOTE — Patient Instructions (Signed)
follow up in one year for annual exam, sooner as needed.

## 2017-01-08 ENCOUNTER — Other Ambulatory Visit: Payer: Self-pay

## 2017-01-08 MED ORDER — PROGESTERONE MICRONIZED 200 MG PO CAPS: ORAL_CAPSULE | ORAL | 4 refills | Status: DC

## 2017-02-05 ENCOUNTER — Telehealth: Payer: Self-pay | Admitting: *Deleted

## 2017-02-05 NOTE — Telephone Encounter (Signed)
Pt called left message in triage voicemail, takes estrace 1 mg and progesterone 200 mg day 1-12 notice a drop of blood 3 days last week and yesterday. No flow. Left on voicemail to schedule OV for exam

## 2017-02-22 ENCOUNTER — Ambulatory Visit: Payer: 59 | Admitting: Gynecology

## 2017-10-11 ENCOUNTER — Other Ambulatory Visit: Payer: Self-pay | Admitting: Gynecology

## 2017-10-11 DIAGNOSIS — Z1231 Encounter for screening mammogram for malignant neoplasm of breast: Secondary | ICD-10-CM

## 2018-01-01 ENCOUNTER — Other Ambulatory Visit: Payer: Self-pay | Admitting: Gynecology

## 2018-01-03 ENCOUNTER — Encounter: Payer: Self-pay | Admitting: Gynecology

## 2018-01-03 ENCOUNTER — Ambulatory Visit: Payer: 59

## 2018-01-03 ENCOUNTER — Ambulatory Visit: Payer: 59 | Admitting: Gynecology

## 2018-01-03 ENCOUNTER — Ambulatory Visit
Admission: RE | Admit: 2018-01-03 | Discharge: 2018-01-03 | Disposition: A | Discharge status: Home / Self Care | Payer: 59 | Source: Ambulatory Visit | Attending: Gynecology | Admitting: Gynecology

## 2018-01-03 VITALS — BP 114/64 | Ht 65.0 in | Wt 163.0 lb

## 2018-01-03 DIAGNOSIS — Z79890 Hormone replacement therapy (postmenopausal): Secondary | ICD-10-CM | POA: Diagnosis not present

## 2018-01-03 DIAGNOSIS — Z23 Encounter for immunization: Secondary | ICD-10-CM

## 2018-01-03 DIAGNOSIS — N952 Postmenopausal atrophic vaginitis: Secondary | ICD-10-CM

## 2018-01-03 DIAGNOSIS — Z1151 Encounter for screening for human papillomavirus (HPV): Secondary | ICD-10-CM

## 2018-01-03 DIAGNOSIS — N950 Postmenopausal bleeding: Secondary | ICD-10-CM | POA: Diagnosis not present

## 2018-01-03 DIAGNOSIS — Z1231 Encounter for screening mammogram for malignant neoplasm of breast: Secondary | ICD-10-CM

## 2018-01-03 DIAGNOSIS — Z01419 Encounter for gynecological examination (general) (routine) without abnormal findings: Secondary | ICD-10-CM | POA: Diagnosis not present

## 2018-01-03 MED ORDER — ESTRADIOL 1 MG PO TABS: 1.0000 mg | ORAL_TABLET | Freq: Every day | ORAL | 2 refills | Status: DC

## 2018-01-03 MED ORDER — PROGESTERONE MICRONIZED 200 MG PO CAPS: ORAL_CAPSULE | ORAL | 2 refills | Status: DC

## 2018-01-03 NOTE — Progress Notes (Signed)
    Christina Church 1955/08/03 [id number]        62 y.o.  G1P1001 for annual gynecologic exam.  Notes that in November last year she had an episode of vaginal spotting.  Had called and was told to come in but failed to do so.  She had a repeat episode of vaginal bleeding in September 2019 with some associated pelvic cramping.  Continues on Estrace 1 mg daily and Prometrium 200 mg for the first 12 days of each month.  Has no bleeding at the end of the Prometrium.  Past medical history,surgical history, problem list, medications, allergies, family history and social history were all reviewed and documented as reviewed in the EPIC chart.  ROS:  Performed with pertinent positives and negatives included in the history, assessment and plan.   Additional significant findings : None   Exam: Kennon Portela assistant Vitals:   01/03/18 1356  BP: 114/64  Weight: 163 lb (73.9 kg)  Height: 5\' 5"  (1.651 m)   Body mass index is 27.12 kg/m.  General appearance:  Normal affect, orientation and appearance. Skin: Grossly normal HEENT: Without gross lesions.  No cervical or supraclavicular adenopathy. Thyroid normal.  Lungs:  Clear without wheezing, rales or rhonchi Cardiac: RR, without RMG Abdominal:  Soft, nontender, without masses, guarding, rebound, organomegaly or hernia Breasts:  Examined lying and sitting without masses, retractions, discharge or axillary adenopathy. Pelvic:  Ext, BUS, Vagina: With atrophic changes  Cervix: With atrophic changes.  Pap smear/HPV  Uterus: Anteverted, normal size, shape and contour, midline and mobile nontender   Adnexa: Without masses or tenderness    Anus and perineum: Normal   Rectovaginal: Normal sphincter tone without palpated masses or tenderness.    Assessment/Plan:  62 y.o. G42P1001 female for annual gynecologic exam.   1. Postmenopausal/HRT.  Episode of vaginal bleeding last November and now in September.  We discussed the differential of  postmenopausal bleeding to include hormonal changes, atrophic changes, polyps, hyperplasia and cancer.  Recommended patient schedule sonohysterogram for endometrial assessment and she agrees to do so.  We again reviewed the risks versus benefits of HRT to include benefits of symptom relief, cardiovascular and bone health versus risks of breast cancer and thrombosis such as stroke heart attack DVT.  At this point the patient wants to continue and I refilled her estradiol 1 mg x 30 days with 2 refills and Prometrium 200 mg first 12 days of each month as she is comfortable taking it this way x2 refills.  We will then reassess after her sonohysterogram and if she continues after that we will refill her for the year. 2. Paxil CR 12.5.  We discussed weaning last year but she never did this.  I again discussed try to wean as it was started in the perimenopause to help her with the transition she is past that.  She agrees to do so and will wean over several weeks time and see how she does. 3. Mammography today.  Breast exam normal today. 4. Pap smear/HPV 2015.  Pap smear/HPV today.  No history of significant abnormal Pap smears. 5. Colonoscopy coming due next year and I reminded her to schedule this. 6. DEXA 2017 normal.  Plan repeat DEXA at 5-year interval. 7. Health maintenance reviewed no routine lab work done as patient does this elsewhere.  Follow-up for sonohysterogram as scheduled.  Follow-up for annual exam in 1 year.   Dara Lords MD, 2:28 PM 01/03/2018

## 2018-01-03 NOTE — Patient Instructions (Signed)
Follow up for ultrasound as scheduled 

## 2018-01-03 NOTE — Addendum Note (Signed)
Addended by: Dayna Barker on: 01/03/2018 03:29 PM   Modules accepted: Orders

## 2018-01-07 LAB — PAP IG AND HPV HIGH-RISK: HPV DNA HIGH RISK: NOT DETECTED

## 2018-01-17 DIAGNOSIS — N950 Postmenopausal bleeding: Secondary | ICD-10-CM

## 2018-01-17 HISTORY — DX: Postmenopausal bleeding: N95.0

## 2018-01-20 ENCOUNTER — Other Ambulatory Visit: Payer: Self-pay | Admitting: Gynecology

## 2018-01-20 DIAGNOSIS — N950 Postmenopausal bleeding: Secondary | ICD-10-CM

## 2018-01-30 ENCOUNTER — Telehealth: Payer: Self-pay

## 2018-01-30 ENCOUNTER — Ambulatory Visit (INDEPENDENT_AMBULATORY_CARE_PROVIDER_SITE_OTHER): Payer: 59

## 2018-01-30 ENCOUNTER — Encounter: Payer: Self-pay | Admitting: Gynecology

## 2018-01-30 ENCOUNTER — Ambulatory Visit: Payer: 59 | Admitting: Gynecology

## 2018-01-30 VITALS — BP 124/80

## 2018-01-30 DIAGNOSIS — N950 Postmenopausal bleeding: Secondary | ICD-10-CM

## 2018-01-30 DIAGNOSIS — Z79890 Hormone replacement therapy (postmenopausal): Secondary | ICD-10-CM

## 2018-01-30 DIAGNOSIS — N840 Polyp of corpus uteri: Secondary | ICD-10-CM

## 2018-01-30 MED ORDER — PROGESTERONE MICRONIZED 200 MG PO CAPS: ORAL_CAPSULE | ORAL | 0 refills | Status: DC

## 2018-01-30 MED ORDER — PROGESTERONE MICRONIZED 200 MG PO CAPS: ORAL_CAPSULE | ORAL | 2 refills | Status: DC

## 2018-01-30 MED ORDER — ESTRADIOL 1 MG PO TABS: 1.0000 mg | ORAL_TABLET | Freq: Every day | ORAL | 2 refills | Status: DC

## 2018-01-30 NOTE — Telephone Encounter (Signed)
Pharmacy sent request for 90 day supply of Progesterone Rx sent today. Rx sent.

## 2018-01-30 NOTE — Progress Notes (Signed)
    Christina Church 04/09/1955 [id number]        62 y.o.  G1P1001 presents for sonohysterogram.  History of vaginal bleeding x1 last year and repeated this past September.  Currently on Estrace 1 mg and Prometrium 200 mg first 12 days of each month.  Past medical history,surgical history, problem list, medications, allergies, family history and social history were all reviewed and documented in the EPIC chart.  Directed ROS with pertinent positives and negatives documented in the history of present illness/assessment and plan.  Exam: [place] assistant BP 124/80 General appearance:  Normal Abdomen soft nontender without masses guarding rebound Pelvic external BUS vagina normal with atrophic changes.  Cervix normal with atrophic changes.  Uterus normal size midline mobile nontender.  Adnexa without masses or tenderness.  Ultrasound transvaginal and transabdominal shows uterus normal size and echotexture.  Endometrium 10.9 mm.  Right and left ovaries normal.  Cul-de-sac negative.  Sonohysterogram performed, sterile technique, single-tooth tenaculum anterior lip stabilization, easy catheter introduction, good distention with 27 x 13 x 18 mm endometrial defect.  Endometrial sample taken.  Patient tolerated well.  Assessment/Plan:  62 y.o. G1P1001 with postmenopausal bleeding on HRT with ultrasound showing endometrial polyp.  Differential discussed to include benign, hyperplastic and malignant.  Recommended we proceed with hysteroscopy D&C with resection of the endometrial polyp.  I reviewed what is involved with the procedure to include the intraoperative and postoperative courses.  Risks to include infection, hemorrhage necessitating transfusion, damage to surrounding structures such as vagina, cervix, uterus.  Perforation with damage to internal organs such as bowel bladder ureters vessels and nerves necessitating major exploratory reparative surgeries and future reparative surgeries such as  ostomy formation.  The patient's questions were answered and we will move towards scheduling the procedure.  She will follow-up for the endometrial biopsy results done today.  The patient did ask me about continuing her HRT.  I think at this point we will go ahead and do this and then reassess after pathology returns and the patient is comfortable with this.  I refilled both of her HRT prescriptions x2 months to cover her past surgery    Dara Lordsimothy P Fontaine MD, 9:40 AM 01/30/2018

## 2018-01-30 NOTE — Patient Instructions (Signed)
Office will call you with biopsy results from the ultrasound and to arrange for surgery

## 2018-02-03 ENCOUNTER — Telehealth: Payer: Self-pay

## 2018-02-03 MED ORDER — MISOPROSTOL 200 MCG PO TABS: ORAL_TABLET | ORAL | 0 refills | Status: DC

## 2018-02-03 NOTE — Telephone Encounter (Signed)
I spoke with patient regarding scheduling surgery. We reviewed her current insurance benefits and estimated surgery prepymt due by one week prior to surgery. I did explain that this estimate is based on 2019 benefits. I will mail her a financial letter and a pamphlet.  She wants to schedule the surgery on a Friday in January. I scheduled her for 04/04/17 at 7:30am. I warned her time may change as we get closer and I have other cases I may need to shuffle to accomodate but would be that morning still.  She understood.  I discussed with her need for Cytotec tab night before surgery. Rx sent. Advised her that it might cause some cramping/spotting/light bleeding but if she sees that no worries just means it is working.    Pre op appt scheduled with Dr. Velvet BatheF for 03/25/18 at 9:45am.

## 2018-03-17 ENCOUNTER — Encounter (HOSPITAL_BASED_OUTPATIENT_CLINIC_OR_DEPARTMENT_OTHER): Payer: Self-pay | Admitting: *Deleted

## 2018-03-20 ENCOUNTER — Other Ambulatory Visit: Payer: Self-pay

## 2018-03-20 ENCOUNTER — Encounter (HOSPITAL_BASED_OUTPATIENT_CLINIC_OR_DEPARTMENT_OTHER): Payer: Self-pay | Admitting: *Deleted

## 2018-03-20 NOTE — Progress Notes (Signed)
Spoke with patient via telephone for pre op interview. NPO after MN. Patient to take protonix with sip of water Am of surgery. Arrival time 0530. Will need CBC and CMP AM of surgery.

## 2018-03-28 ENCOUNTER — Encounter: Payer: Self-pay | Admitting: Gynecology

## 2018-03-28 ENCOUNTER — Ambulatory Visit: Payer: 59 | Admitting: Gynecology

## 2018-03-28 VITALS — BP 118/76

## 2018-03-28 DIAGNOSIS — Z79890 Hormone replacement therapy (postmenopausal): Secondary | ICD-10-CM

## 2018-03-28 DIAGNOSIS — N840 Polyp of corpus uteri: Secondary | ICD-10-CM | POA: Diagnosis not present

## 2018-03-28 DIAGNOSIS — N950 Postmenopausal bleeding: Secondary | ICD-10-CM | POA: Diagnosis not present

## 2018-03-28 NOTE — Patient Instructions (Signed)
Followup for surgery as scheduled. 

## 2018-03-28 NOTE — Progress Notes (Signed)
    Christina Church 1955/07/23 [id number]        63 y.o.  G1P1001 presents for preoperative appointment for upcoming hysteroscopy D&C due to postmenopausal bleeding and endometrial polyp.  She had several episodes of postmenopausal bleeding this past year.  Is on Estrace 1 mg and Prometrium 200 mg for the first 12 days of each month for HRT.  Sonohysterogram showed 27 x 13 x 18 mm endometrial defect consistent with polyp.  Endometrial biopsy showed atrophic endometrium.  Past medical history,surgical history, problem list, medications, allergies, family history and social history were all reviewed and documented in the EPIC chart.  Directed ROS with pertinent positives and negatives documented in the history of present illness/assessment and plan.  Exam: Kennon Portela assistant Vitals:   03/28/18 0904  BP: 118/76   General appearance:  Normal HEENT normal Lungs clear Cardiac regular rate no rubs murmurs or gallops Abdomen soft nontender without masses guarding rebound Pelvic external BUS vagina with atrophic changes.  Cervix with atrophic changes.  Uterus normal size midline mobile nontender.  Adnexa without masses or tenderness.  Assessment/Plan:  63 y.o. G1P1001 with postmenopausal bleeding on HRT.  Sonohysterogram shows large endometrial defect consistent with polyp.  Endometrial biopsy negative.  For hysteroscopy D&C with resection of the endometrial polyp.  I reviewed the proposed surgery with the patient to include the expected intraoperative and postoperative courses as well as the recovery period. The use of the hysteroscope, resectoscope and the D&C portion were all discussed. The risks of surgery to include infection, prolonged antibiotics, hemorrhage necessitating transfusion and the risks of transfusion, including transfusion reaction, hepatitis, HIV, mad cow disease and other unknown entities were all discussed understood and accepted. The risk of damage to internal organs during  the procedure, either immediately recognized or delay recognized, including vagina, cervix, uterus, possible perforation causing damage to bowel, bladder, ureters, vessels and nerves necessitating major exploratory reparative surgery and future reparative surgeries including bladder repair, ureteral damage repair, bowel resection, ostomy formation was also discussed understood and accepted.  She understands there are no guarantees this will permanently take care of her issues and that she may regrow polyps in the future.  The patient's questions were answered to her satisfaction and she is ready to proceed with surgery.   Dara Lords MD, 9:32 AM 03/28/2018

## 2018-03-28 NOTE — H&P (Signed)
Christina Church 02/14/1956 [id number]   History and Physical  Chief complaint: Postmenopausal bleeding, HRT, endometrial polyp  History of present illness: 63 y.o. G1P1001 for hysteroscopy D&C due to postmenopausal bleeding and endometrial polyp.  She had several episodes of postmenopausal bleeding this past year.  Is on Estrace 1 mg and Prometrium 200 mg for the first 12 days of each month for HRT.  Sonohysterogram showed 27 x 13 x 18 mm endometrial defect consistent with polyp.  Endometrial biopsy showed atrophic endometrium.  Past Medical History:  Diagnosis Date  . Migraines   . Post-menopausal bleeding 01/2018    Past Surgical History:  Procedure Laterality Date  . CARPAL TUNNEL RELEASE     right and left hand  . COLON BIOPSY      Family History  Problem Relation Age of Onset  . Diabetes Mother   . Osteoporosis Mother   . Lung cancer Mother   . Stroke Mother   . Hypertension Father   . Lung cancer Father   . Stroke Father   . Breast cancer Maternal Aunt 55  . Cancer Maternal Aunt        BONE AND LUNG    Social History:  reports that she has never smoked. She has never used smokeless tobacco. She reports that she does not drink alcohol or use drugs.  Allergies:  Allergies  Allergen Reactions  . Codeine-Guaifenesin Other (See Comments)    "Disoriented"  . Meclizine     Disoriented    Medications: See epic for most current list  ROS:  Was performed and pertinent positives and negatives are included in the history of present illness.  Exam:  Kennon Portela assistant Vitals:   03/28/18 0904  BP: 118/76   General appearance:  Normal HEENT normal Lungs clear Cardiac regular rate no rubs murmurs or gallops Abdomen soft nontender without masses guarding rebound Pelvic external BUS vagina with atrophic changes.  Cervix with atrophic changes.  Uterus normal size midline mobile nontender.  Adnexa without masses or tenderness.    Assessment/Plan:  64 y.o.  G1P1001 with postmenopausal bleeding on HRT.  Sonohysterogram shows large endometrial defect consistent with polyp.  Endometrial biopsy negative.  For hysteroscopy D&C with resection of the endometrial polyp.  I reviewed the proposed surgery with the patient to include the expected intraoperative and postoperative courses as well as the recovery period. The use of the hysteroscope, resectoscope and the D&C portion were all discussed. The risks of surgery to include infection, prolonged antibiotics, hemorrhage necessitating transfusion and the risks of transfusion, including transfusion reaction, hepatitis, HIV, mad cow disease and other unknown entities were all discussed understood and accepted. The risk of damage to internal organs during the procedure, either immediately recognized or delay recognized, including vagina, cervix, uterus, possible perforation causing damage to bowel, bladder, ureters, vessels and nerves necessitating major exploratory reparative surgery and future reparative surgeries including bladder repair, ureteral damage repair, bowel resection, ostomy formation was also discussed understood and accepted.  She understands there are no guarantees this will permanently take care of her issues and that she may regrow polyps in the future.  The patient's questions were answered to her satisfaction and she is ready to proceed with surgery.    Dara Lords MD, 9:36 AM 03/28/2018

## 2018-04-01 ENCOUNTER — Encounter: Payer: Self-pay | Admitting: Gynecology

## 2018-04-03 NOTE — Anesthesia Preprocedure Evaluation (Addendum)
Anesthesia Evaluation  Patient identified by MRN, date of birth, ID band Patient awake    Reviewed: Allergy & Precautions, NPO status , Patient's Chart, lab work & pertinent test results  History of Anesthesia Complications Negative for: history of anesthetic complications  Airway Mallampati: II  TM Distance: >3 FB Neck ROM: Full    Dental no notable dental hx. (+) Dental Advisory Given   Pulmonary neg pulmonary ROS,    Pulmonary exam normal        Cardiovascular negative cardio ROS Normal cardiovascular exam     Neuro/Psych  Headaches,    GI/Hepatic negative GI ROS, Neg liver ROS,   Endo/Other  negative endocrine ROS  Renal/GU negative Renal ROS     Musculoskeletal negative musculoskeletal ROS (+)   Abdominal   Peds  Hematology negative hematology ROS (+)   Anesthesia Other Findings Day of surgery medications reviewed with the patient.  Reproductive/Obstetrics                            Anesthesia Physical Anesthesia Plan  ASA: II  Anesthesia Plan: General   Post-op Pain Management:    Induction: Intravenous  PONV Risk Score and Plan: 4 or greater and Ondansetron, Dexamethasone, Scopolamine patch - Pre-op and Diphenhydramine  Airway Management Planned: LMA  Additional Equipment:   Intra-op Plan:   Post-operative Plan: Extubation in OR  Informed Consent: I have reviewed the patients History and Physical, chart, labs and discussed the procedure including the risks, benefits and alternatives for the proposed anesthesia with the patient or authorized representative who has indicated his/her understanding and acceptance.     Dental advisory given  Plan Discussed with: CRNA and Anesthesiologist  Anesthesia Plan Comments:        Anesthesia Quick Evaluation

## 2018-04-04 ENCOUNTER — Encounter (HOSPITAL_BASED_OUTPATIENT_CLINIC_OR_DEPARTMENT_OTHER)
Admission: RE | Disposition: A | Discharge status: Home / Self Care | Payer: Self-pay | Source: Home / Self Care | Attending: Gynecology

## 2018-04-04 ENCOUNTER — Ambulatory Visit (HOSPITAL_BASED_OUTPATIENT_CLINIC_OR_DEPARTMENT_OTHER): Payer: 59 | Admitting: Anesthesiology

## 2018-04-04 ENCOUNTER — Other Ambulatory Visit: Payer: Self-pay

## 2018-04-04 ENCOUNTER — Encounter (HOSPITAL_BASED_OUTPATIENT_CLINIC_OR_DEPARTMENT_OTHER): Payer: Self-pay | Admitting: *Deleted

## 2018-04-04 ENCOUNTER — Ambulatory Visit (HOSPITAL_BASED_OUTPATIENT_CLINIC_OR_DEPARTMENT_OTHER)
Admission: RE | Admit: 2018-04-04 | Discharge: 2018-04-04 | Disposition: A | Discharge status: Home / Self Care | Payer: 59 | Attending: Gynecology | Admitting: Gynecology

## 2018-04-04 DIAGNOSIS — Z803 Family history of malignant neoplasm of breast: Secondary | ICD-10-CM | POA: Diagnosis not present

## 2018-04-04 DIAGNOSIS — G43909 Migraine, unspecified, not intractable, without status migrainosus: Secondary | ICD-10-CM | POA: Diagnosis not present

## 2018-04-04 DIAGNOSIS — Z885 Allergy status to narcotic agent status: Secondary | ICD-10-CM | POA: Diagnosis not present

## 2018-04-04 DIAGNOSIS — Z8249 Family history of ischemic heart disease and other diseases of the circulatory system: Secondary | ICD-10-CM | POA: Insufficient documentation

## 2018-04-04 DIAGNOSIS — Z888 Allergy status to other drugs, medicaments and biological substances status: Secondary | ICD-10-CM | POA: Insufficient documentation

## 2018-04-04 DIAGNOSIS — N840 Polyp of corpus uteri: Secondary | ICD-10-CM | POA: Diagnosis not present

## 2018-04-04 DIAGNOSIS — Z801 Family history of malignant neoplasm of trachea, bronchus and lung: Secondary | ICD-10-CM | POA: Insufficient documentation

## 2018-04-04 DIAGNOSIS — N950 Postmenopausal bleeding: Secondary | ICD-10-CM | POA: Diagnosis present

## 2018-04-04 DIAGNOSIS — Z833 Family history of diabetes mellitus: Secondary | ICD-10-CM | POA: Diagnosis not present

## 2018-04-04 DIAGNOSIS — Z823 Family history of stroke: Secondary | ICD-10-CM | POA: Insufficient documentation

## 2018-04-04 DIAGNOSIS — Z79890 Hormone replacement therapy (postmenopausal): Secondary | ICD-10-CM | POA: Diagnosis not present

## 2018-04-04 DIAGNOSIS — Z808 Family history of malignant neoplasm of other organs or systems: Secondary | ICD-10-CM | POA: Diagnosis not present

## 2018-04-04 DIAGNOSIS — Z8262 Family history of osteoporosis: Secondary | ICD-10-CM | POA: Diagnosis not present

## 2018-04-04 HISTORY — PX: DILATATION & CURETTAGE/HYSTEROSCOPY WITH MYOSURE: SHX6511

## 2018-04-04 HISTORY — DX: Postmenopausal bleeding: N95.0

## 2018-04-04 LAB — COMPREHENSIVE METABOLIC PANEL
ALBUMIN: 4.3 g/dL (ref 3.5–5.0)
ALT: 18 U/L (ref 0–44)
AST: 23 U/L (ref 15–41)
Alkaline Phosphatase: 58 U/L (ref 38–126)
Anion gap: 10 (ref 5–15)
BUN: 22 mg/dL (ref 8–23)
CO2: 23 mmol/L (ref 22–32)
Calcium: 8.7 mg/dL — ABNORMAL LOW (ref 8.9–10.3)
Chloride: 104 mmol/L (ref 98–111)
Creatinine, Ser: 0.53 mg/dL (ref 0.44–1.00)
GFR calc Af Amer: >60 mL/min (ref >60)
GFR calc non Af Amer: >60 mL/min (ref >60)
Glucose, Bld: 96 mg/dL (ref 70–99)
Potassium: 3.9 mmol/L (ref 3.5–5.1)
Sodium: 137 mmol/L (ref 135–145)
Total Bilirubin: 0.7 mg/dL (ref 0.3–1.2)
Total Protein: 7.4 g/dL (ref 6.5–8.1)

## 2018-04-04 LAB — CBC
HEMATOCRIT: 40.5 % (ref 36.0–46.0)
Hemoglobin: 13.1 g/dL (ref 12.0–15.0)
MCH: 28.2 pg (ref 26.0–34.0)
MCHC: 32.3 g/dL (ref 30.0–36.0)
MCV: 87.1 fL (ref 80.0–100.0)
Platelets: 161 10*3/uL (ref 150–400)
RBC: 4.65 MIL/uL (ref 3.87–5.11)
RDW: 13.8 % (ref 11.5–15.5)
WBC: 8.2 10*3/uL (ref 4.0–10.5)
nRBC: 0 % (ref 0.0–0.2)

## 2018-04-04 SURGERY — DILATATION & CURETTAGE/HYSTEROSCOPY WITH MYOSURE
Anesthesia: General

## 2018-04-04 MED ORDER — PROPOFOL 10 MG/ML IV BOLUS
INTRAVENOUS | Status: DC | PRN
  Administered 2018-04-04: 160 mg via INTRAVENOUS
  Administered 2018-04-04: 40 mg via INTRAVENOUS

## 2018-04-04 MED ORDER — SODIUM CHLORIDE 0.9 % IV SOLN
2.0000 g | INTRAVENOUS | Status: AC
  Administered 2018-04-04: 2 g via INTRAVENOUS
  Filled 2018-04-04: qty 2

## 2018-04-04 MED ORDER — FENTANYL CITRATE (PF) 100 MCG/2ML IJ SOLN
25.0000 ug | INTRAMUSCULAR | Status: DC | PRN
  Filled 2018-04-04: qty 1

## 2018-04-04 MED ORDER — FENTANYL CITRATE (PF) 100 MCG/2ML IJ SOLN
INTRAMUSCULAR | Status: AC
  Filled 2018-04-04: qty 2

## 2018-04-04 MED ORDER — LIDOCAINE 2% (20 MG/ML) 5 ML SYRINGE
INTRAMUSCULAR | Status: DC | PRN
  Administered 2018-04-04: 100 mg via INTRAVENOUS

## 2018-04-04 MED ORDER — SODIUM CHLORIDE 0.9 % IV SOLN
INTRAVENOUS | Status: AC
  Filled 2018-04-04: qty 2

## 2018-04-04 MED ORDER — LACTATED RINGERS IV SOLN
INTRAVENOUS | Status: DC
  Administered 2018-04-04: 06:00:00 via INTRAVENOUS
  Filled 2018-04-04: qty 1000

## 2018-04-04 MED ORDER — SCOPOLAMINE 1 MG/3DAYS TD PT72
1.0000 | MEDICATED_PATCH | TRANSDERMAL | Status: DC
  Administered 2018-04-04: 1.5 mg via TRANSDERMAL
  Filled 2018-04-04: qty 1

## 2018-04-04 MED ORDER — DEXAMETHASONE SODIUM PHOSPHATE 10 MG/ML IJ SOLN
INTRAMUSCULAR | Status: DC | PRN
  Administered 2018-04-04: 4 mg via INTRAVENOUS

## 2018-04-04 MED ORDER — MIDAZOLAM HCL 5 MG/5ML IJ SOLN
INTRAMUSCULAR | Status: DC | PRN
  Administered 2018-04-04: 2 mg via INTRAVENOUS

## 2018-04-04 MED ORDER — KETOROLAC TROMETHAMINE 30 MG/ML IJ SOLN
INTRAMUSCULAR | Status: DC | PRN
  Administered 2018-04-04: 30 mg via INTRAVENOUS

## 2018-04-04 MED ORDER — DEXAMETHASONE SODIUM PHOSPHATE 10 MG/ML IJ SOLN
INTRAMUSCULAR | Status: AC
  Filled 2018-04-04: qty 1

## 2018-04-04 MED ORDER — PROMETHAZINE HCL 25 MG/ML IJ SOLN
6.2500 mg | INTRAMUSCULAR | Status: DC | PRN
  Filled 2018-04-04: qty 1

## 2018-04-04 MED ORDER — ONDANSETRON HCL 4 MG/2ML IJ SOLN
INTRAMUSCULAR | Status: DC | PRN
  Administered 2018-04-04: 4 mg via INTRAVENOUS

## 2018-04-04 MED ORDER — ONDANSETRON HCL 4 MG/2ML IJ SOLN
INTRAMUSCULAR | Status: AC
  Filled 2018-04-04: qty 2

## 2018-04-04 MED ORDER — MIDAZOLAM HCL 2 MG/2ML IJ SOLN
INTRAMUSCULAR | Status: AC
  Filled 2018-04-04: qty 2

## 2018-04-04 MED ORDER — LIDOCAINE 2% (20 MG/ML) 5 ML SYRINGE
INTRAMUSCULAR | Status: AC
  Filled 2018-04-04: qty 5

## 2018-04-04 MED ORDER — SCOPOLAMINE 1 MG/3DAYS TD PT72
MEDICATED_PATCH | TRANSDERMAL | Status: AC
  Filled 2018-04-04: qty 1

## 2018-04-04 MED ORDER — FENTANYL CITRATE (PF) 100 MCG/2ML IJ SOLN
INTRAMUSCULAR | Status: DC | PRN
  Administered 2018-04-04: 50 ug via INTRAVENOUS

## 2018-04-04 MED ORDER — PROPOFOL 10 MG/ML IV BOLUS
INTRAVENOUS | Status: AC
  Filled 2018-04-04: qty 40

## 2018-04-04 MED ORDER — LIDOCAINE HCL 1 % IJ SOLN
INTRAMUSCULAR | Status: DC | PRN
  Administered 2018-04-04: 10 mL

## 2018-04-04 MED ORDER — KETOROLAC TROMETHAMINE 30 MG/ML IJ SOLN
INTRAMUSCULAR | Status: AC
  Filled 2018-04-04: qty 1

## 2018-04-04 SURGICAL SUPPLY — 19 items
CANISTER SUCT 3000ML PPV (MISCELLANEOUS) ×2 IMPLANT
CATH ROBINSON RED A/P 16FR (CATHETERS) ×2 IMPLANT
COVER WAND RF STERILE (DRAPES) ×2 IMPLANT
DEVICE MYOSURE LITE (MISCELLANEOUS) IMPLANT
DEVICE MYOSURE REACH (MISCELLANEOUS) ×1 IMPLANT
DILATOR CANAL MILEX (MISCELLANEOUS) IMPLANT
GAUZE 4X4 16PLY RFD (DISPOSABLE) ×2 IMPLANT
GLOVE BIO SURGEON STRL SZ7.5 (GLOVE) ×4 IMPLANT
GOWN STRL REUS W/TWL XL LVL3 (GOWN DISPOSABLE) ×2 IMPLANT
IV NS IRRIG 3000ML ARTHROMATIC (IV SOLUTION) ×2 IMPLANT
KIT PROCEDURE FLUENT (KITS) ×2 IMPLANT
KIT TURNOVER CYSTO (KITS) ×2 IMPLANT
MYOSURE XL FIBROID (MISCELLANEOUS)
PACK VAGINAL MINOR WOMEN LF (CUSTOM PROCEDURE TRAY) ×2 IMPLANT
PAD OB MATERNITY 4.3X12.25 (PERSONAL CARE ITEMS) ×2 IMPLANT
SEAL ROD LENS SCOPE MYOSURE (ABLATOR) ×2 IMPLANT
SYSTEM TISS REMOVAL MYOSURE XL (MISCELLANEOUS) IMPLANT
TOWEL OR 17X24 6PK STRL BLUE (TOWEL DISPOSABLE) ×4 IMPLANT
WATER STERILE IRR 500ML POUR (IV SOLUTION) IMPLANT

## 2018-04-04 NOTE — Transfer of Care (Signed)
Immediate Anesthesia Transfer of Care Note  Patient: Christina Church  Procedure(s) Performed: DILATATION & CURETTAGE/HYSTEROSCOPY WITH MYOSURE (N/A )  Patient Location: PACU  Anesthesia Type:General  Level of Consciousness: drowsy  Airway & Oxygen Therapy: Patient Spontanous Breathing and Patient connected to nasal cannula oxygen  Post-op Assessment: Report given to RN  Post vital signs: Reviewed and stable  Last Vitals: 135/65, 76, 16, 100% Vitals Value Taken Time  BP    Temp    Pulse 76 04/04/2018  7:57 AM  Resp    SpO2 99 % 04/04/2018  7:57 AM  Vitals shown include unvalidated device data.  Last Pain:  Vitals:   04/04/18 0548  TempSrc:   PainSc: 0-No pain      Patients Stated Pain Goal: 8 (04/04/18 0548)  Complications: No apparent anesthesia complications

## 2018-04-04 NOTE — Anesthesia Postprocedure Evaluation (Signed)
Anesthesia Post Note  Patient: Meca Brusca Boyd  Procedure(s) Performed: DILATATION & CURETTAGE/HYSTEROSCOPY WITH MYOSURE (N/A )     Patient location during evaluation: PACU Anesthesia Type: General Level of consciousness: sedated Pain management: pain level controlled Vital Signs Assessment: post-procedure vital signs reviewed and stable Respiratory status: spontaneous breathing and respiratory function stable Cardiovascular status: stable Postop Assessment: no apparent nausea or vomiting Anesthetic complications: no    Last Vitals:  Vitals:   04/04/18 0815 04/04/18 0830  BP: (!) 115/58 (!) 105/59  Pulse: 70 60  Resp: 15 15  Temp:    SpO2: 98% 97%    Last Pain:  Vitals:   04/04/18 0845  TempSrc:   PainSc: 0-No pain                 Abid Bolla DANIEL

## 2018-04-04 NOTE — Discharge Instructions (Signed)
° °  Postoperative Instructions Hysteroscopy D & C  Dr. Audie Box and the nursing staff have discussed postoperative instructions with you.  If you have any questions please ask them before you leave the hospital, or call Dr Marva Panda office at [phone].    We would like to emphasize the following instructions:   ? Call the office to make your follow-up appointment as recommended by Dr Audie Box (usually 1-2 weeks).  ? Take ibuprofen 600 mg every 6 hours as needed for pain.  You may substitute Tylenol if you cannot take ibuprofen.  ? You may eat a regular diet, but slowly until you start having bowel movements.  ? Drink plenty of water daily.  ? Nothing in the vagina (intercourse, douching, objects of any kind) for two weeks.  When reinitiating intercourse, if it is uncomfortable, stop and make an appointment with Dr Audie Box to be evaluated.  ? No driving for one to two days until the effects of anesthesia has worn off.  No traveling out of town for several days.  ? You may shower, but no baths for one week.  Walking up and down stairs is ok.  No heavy lifting, prolonged standing, repeated bending or any working out until your post op check.  ? Rest frequently, listen to your body and do not push yourself and overdo it.  ? Call if:  o Your pain medication does not seem strong enough. o Worsening pain or abdominal bloating o Persistent nausea or vomiting o Difficulty with urination or bowel movements. o Temperature of 101 degrees or higher. o Heavy vaginal bleeding.  If your period is due, you may use tampons. o You have any questions or concerns    Post Anesthesia Home Care Instructions  Activity: Get plenty of rest for the remainder of the day. A responsible individual must stay with you for 24 hours following the procedure.  For the next 24 hours, DO NOT: -Drive a car -Advertising copywriter -Drink alcoholic beverages -Take any medication unless instructed by your  physician -Make any legal decisions or sign important papers.  Meals: Start with liquid foods such as gelatin or soup. Progress to regular foods as tolerated. Avoid greasy, spicy, heavy foods. If nausea and/or vomiting occur, drink only clear liquids until the nausea and/or vomiting subsides. Call your physician if vomiting continues.  Special Instructions/Symptoms: Your throat may feel dry or sore from the anesthesia or the breathing tube placed in your throat during surgery. If this causes discomfort, gargle with warm salt water. The discomfort should disappear within 24 hours.  If you had a scopolamine patch placed behind your ear for the management of post- operative nausea and/or vomiting:  1. The medication in the patch is effective for 72 hours, after which it should be removed.  Wrap patch in a tissue and discard in the trash. Wash hands thoroughly with soap and water. 2. You may remove the patch earlier than 72 hours if you experience unpleasant side effects which may include dry mouth, dizziness or visual disturbances. 3. Avoid touching the patch. Wash your hands with soap and water after contact with the patch.

## 2018-04-04 NOTE — Anesthesia Procedure Notes (Signed)
Procedure Name: LMA Insertion Date/Time: 04/04/2018 7:27 AM Performed by: Briant Sites, CRNA Pre-anesthesia Checklist: Patient identified, Emergency Drugs available, Suction available and Patient being monitored Patient Re-evaluated:Patient Re-evaluated prior to induction Oxygen Delivery Method: Circle system utilized Preoxygenation: Pre-oxygenation with 100% oxygen Induction Type: IV induction Ventilation: Mask ventilation without difficulty LMA: LMA inserted LMA Size: 4.0 Number of attempts: 1 Airway Equipment and Method: Bite block Placement Confirmation: positive ETCO2 Tube secured with: Tape Dental Injury: Teeth and Oropharynx as per pre-operative assessment

## 2018-04-04 NOTE — H&P (Signed)
The patient was examined.  I reviewed the proposed surgery and consent form with the patient.  The dictated history and physical is current and accurate and all questions were answered. The patient is ready to proceed with surgery and has a realistic understanding and expectation for the outcome.   Dara Lords MD, 7:09 AM 04/04/2018

## 2018-04-04 NOTE — Op Note (Signed)
Suraiya Baris Ghazi 1955-04-13 [id number]   Post Operative Note   Date of surgery:  04/04/2018  Pre Op Dx: Postmenopausal bleeding, endometrial polyp, hormone replacement therapy  Post Op Dx: Postmenopausal bleeding, endometrial polyp, hormone replacement therapy  Procedure: Hysteroscopy, D&C, MyoSure resection endometrial polyp  Surgeon:  Nadyne Coombes Fontaine  Anesthesia:  General  EBL: 5 cc  Distended media discrepancy: 100 cc saline  Complications:  None  Specimen: #1 endometrial curetting #2 endometrial polyp to pathology  Findings: EUA: External BUS vagina with atrophic changes.  Cervix with atrophic changes.  Uterus grossly normal size midline mobile.  Adnexa without masses   Hysteroscopy: Adequate noting fundus, anterior/posterior endometrial surfaces, right/left tubal ostia, lower uterine segment and endocervical canal all visualized.  Large fingerlike polyp filling the endometrial cavity originating from the left lateral endometrial surface.  Resected in it's entirety to the level of the surrounding endometrium  Procedure:  The patient was taken to the operating room, was placed in the low dorsal lithotomy position, underwent general anesthesia, received a perineal/vaginal preparation per nursing personnel and the bladder was emptied with an in and out Foley catheterization. The timeout was performed by the surgical team. An EUA was performed. The patient was draped in the usual fashion. The cervix was visualized with a speculum, anterior lip grasped with a single-tooth tenaculum and a paracervical block was placed using 10 cc's of 1% lidocaine. The cervix was gently dilated to admit the Myosure hysteroscope and hysteroscopy was performed with findings noted above. Using the Myosure Reach resectoscopic wand the polyp was resected in it's entirety to the level the surrounding endometrium. A gentle sharp curettage was performed. Both specimens were sent separately to pathology.  Repeat  hysteroscopy showed an empty cavity with good distention and no evidence of perforation. The instruments were removed and adequate hemostasis was visualized at the tenaculum site and external cervical os.  The specimens were identified for pathology.  The sponge, needle and instrument count were verified correct and the patient wanded.  The patient was given intraoperative Toradol, was awakened without difficulty and was taken to the recovery room in good condition having tolerated the procedure well.     Dara Lords MD, 7:53 AM 04/04/2018    `

## 2018-04-07 ENCOUNTER — Encounter (HOSPITAL_BASED_OUTPATIENT_CLINIC_OR_DEPARTMENT_OTHER): Payer: Self-pay | Admitting: Gynecology

## 2018-04-18 ENCOUNTER — Encounter: Payer: Self-pay | Admitting: Gynecology

## 2018-04-18 ENCOUNTER — Ambulatory Visit (INDEPENDENT_AMBULATORY_CARE_PROVIDER_SITE_OTHER): Payer: 59 | Admitting: Gynecology

## 2018-04-18 VITALS — BP 120/70

## 2018-04-18 DIAGNOSIS — Z09 Encounter for follow-up examination after completed treatment for conditions other than malignant neoplasm: Secondary | ICD-10-CM

## 2018-04-18 MED ORDER — PROGESTERONE MICRONIZED 100 MG PO CAPS: 100.0000 mg | ORAL_CAPSULE | Freq: Every day | ORAL | 10 refills | Status: DC

## 2018-04-18 MED ORDER — ESTRADIOL 1 MG PO TABS: 1.0000 mg | ORAL_TABLET | Freq: Every day | ORAL | 10 refills | Status: DC

## 2018-04-18 MED ORDER — PAROXETINE HCL ER 12.5 MG PO TB24: 12.5000 mg | ORAL_TABLET | ORAL | 10 refills | Status: DC

## 2018-04-18 NOTE — Patient Instructions (Signed)
Follow-up for your annual exam this coming fall.  Sooner if any issues

## 2018-04-18 NOTE — Progress Notes (Signed)
    Likisha Soisson Schweppe [birth year] [id number]        63 y.o.  G1P1001 presents for her postoperative visit status post hysteroscopic resection endometrial polyps.  Has done well with no bleeding or pain.  Past medical history,surgical history, problem list, medications, allergies, family history and social history were all reviewed and documented in the EPIC chart.  Directed ROS with pertinent positives and negatives documented in the history of present illness/assessment and plan.  Exam: Kennon Portela assistant Vitals:   04/18/18 1101  BP: 120/70   General appearance:  Normal Abdomen soft nontender without masses guarding rebound Pelvic external BUS vagina normal with atrophic changes.  Cervix normal with atrophic changes.  Uterus normal size midline mobile nontender.  Adnexa without masses or tenderness.  Assessment/Plan:  63 y.o. G1P1001 with normal postoperative visit status post hysteroscopic resection of benign endometrial polyps.  I reviewed pictures from the surgery and her pathology report.  Patient is on estradiol 1 mg daily.  Has been using Prometrium 200 mg first 12 days of each month.  Recommended she switch to Prometrium 100 mg nightly throughout the month and continue on her estradiol 1 mg.  Patient will switch over and will call if she has any issues after doing so.  I also refilled her Paxil-CR through end of this year when she is due for her annual exam.    Dara Lords MD, 11:34 AM 04/18/2018

## 2018-05-05 ENCOUNTER — Telehealth: Payer: Self-pay | Admitting: *Deleted

## 2018-05-05 MED ORDER — PROGESTERONE MICRONIZED 200 MG PO CAPS: 200.0000 mg | ORAL_CAPSULE | Freq: Every day | ORAL | 1 refills | Status: DC

## 2018-05-05 NOTE — Telephone Encounter (Signed)
Increase Prometrium to 200 mg nightly and to stay on this for the next 2 months.  Stay on the same estrogen dose.  Then back down to Prometrium 100 mg after 2 months.  We will see if this does not eliminate the bleeding.

## 2018-05-05 NOTE — Telephone Encounter (Signed)
Patient said you wanted her to call if any bleeding, post hysteroscopic resection endometrial polyps 04/04/18.  she now taking Prometrium 100 mg at bedtime, bleeding started on 05/01/18 still bleeding, not heavy, medium flow. Please advise

## 2018-05-05 NOTE — Telephone Encounter (Signed)
Patient informed. Rx sent 

## 2018-07-28 ENCOUNTER — Other Ambulatory Visit: Payer: Self-pay | Admitting: Gynecology

## 2018-10-23 ENCOUNTER — Other Ambulatory Visit: Payer: Self-pay | Admitting: Gynecology

## 2018-10-23 DIAGNOSIS — Z1231 Encounter for screening mammogram for malignant neoplasm of breast: Secondary | ICD-10-CM

## 2018-11-03 ENCOUNTER — Other Ambulatory Visit: Payer: Self-pay | Admitting: Gynecology

## 2018-12-16 ENCOUNTER — Encounter: Payer: Self-pay | Admitting: Gynecology

## 2019-01-03 DIAGNOSIS — M25561 Pain in right knee: Secondary | ICD-10-CM | POA: Diagnosis not present

## 2019-01-03 DIAGNOSIS — M25512 Pain in left shoulder: Secondary | ICD-10-CM | POA: Diagnosis not present

## 2019-01-15 DIAGNOSIS — Z0184 Encounter for antibody response examination: Secondary | ICD-10-CM | POA: Diagnosis not present

## 2019-01-15 DIAGNOSIS — Z0001 Encounter for general adult medical examination with abnormal findings: Secondary | ICD-10-CM | POA: Diagnosis not present

## 2019-01-15 DIAGNOSIS — Z13220 Encounter for screening for lipoid disorders: Secondary | ICD-10-CM | POA: Diagnosis not present

## 2019-01-23 ENCOUNTER — Ambulatory Visit: Payer: BC Managed Care – PPO | Admitting: Gynecology

## 2019-01-23 ENCOUNTER — Ambulatory Visit
Admission: RE | Admit: 2019-01-23 | Discharge: 2019-01-23 | Disposition: A | Discharge status: Home / Self Care | Payer: BC Managed Care – PPO | Source: Ambulatory Visit | Attending: Gynecology | Admitting: Gynecology

## 2019-01-23 ENCOUNTER — Other Ambulatory Visit: Payer: Self-pay

## 2019-01-23 ENCOUNTER — Encounter: Payer: Self-pay | Admitting: Gynecology

## 2019-01-23 VITALS — BP 124/76 | Ht 64.0 in | Wt 164.0 lb

## 2019-01-23 DIAGNOSIS — Z1231 Encounter for screening mammogram for malignant neoplasm of breast: Secondary | ICD-10-CM | POA: Diagnosis not present

## 2019-01-23 DIAGNOSIS — N952 Postmenopausal atrophic vaginitis: Secondary | ICD-10-CM

## 2019-01-23 DIAGNOSIS — Z01419 Encounter for gynecological examination (general) (routine) without abnormal findings: Secondary | ICD-10-CM

## 2019-01-23 DIAGNOSIS — N950 Postmenopausal bleeding: Secondary | ICD-10-CM

## 2019-01-23 DIAGNOSIS — Z79890 Hormone replacement therapy (postmenopausal): Secondary | ICD-10-CM

## 2019-01-23 MED ORDER — PAROXETINE HCL ER 12.5 MG PO TB24: 12.5000 mg | ORAL_TABLET | ORAL | 4 refills | Status: DC

## 2019-01-23 MED ORDER — PROGESTERONE MICRONIZED 100 MG PO CAPS: 100.0000 mg | ORAL_CAPSULE | Freq: Every day | ORAL | 4 refills | Status: DC

## 2019-01-23 MED ORDER — ESTRADIOL 1 MG PO TABS: 1.0000 mg | ORAL_TABLET | Freq: Every day | ORAL | 4 refills | Status: DC

## 2019-01-23 NOTE — Progress Notes (Signed)
    Christina Church 08/11/1955 [id number]        63 y.o.  G1P1001 for annual gynecologic exam.  Had one episode of spotting last week x1 day.  None since.  No discomfort with this.  Had hysteroscopy D&C with resection of benign endometrial polyp 10 months ago.  Continues on HRT.  Past medical history,surgical history, problem list, medications, allergies, family history and social history were all reviewed and documented as reviewed in the EPIC chart.  ROS:  Performed with pertinent positives and negatives included in the history, assessment and plan.   Additional significant findings : None   Exam: Caryn Bee assistant Vitals:   01/23/19 0958  BP: 124/76  Weight: 164 lb (74.4 kg)  Height: 5\' 4"  (1.626 m)   Body mass index is 28.15 kg/m.  General appearance:  Normal affect, orientation and appearance. Skin: Grossly normal HEENT: Without gross lesions.  No cervical or supraclavicular adenopathy. Thyroid normal.  Lungs:  Clear without wheezing, rales or rhonchi Cardiac: RR, without RMG Abdominal:  Soft, nontender, without masses, guarding, rebound, organomegaly or hernia Breasts:  Examined lying and sitting without masses, retractions, discharge or axillary adenopathy. Pelvic:  Ext, BUS, Vagina: With atrophic changes  Cervix: With atrophic changes  Uterus: Anteverted, normal size, shape and contour, midline and mobile nontender   Adnexa: Without masses or tenderness    Anus and perineum: Normal   Rectovaginal: Normal sphincter tone without palpated masses or tenderness.    Assessment/Plan:  63 y.o. G25P1001 female for annual gynecologic exam.   1. Postmenopausal/HRT/spotting x1.  We discussed her spotting history.  10 months ago had hysteroscopy D&C with resection of benign endometrial polyp.  Options for management now were reviewed to include expectant versus ultrasound/sonohysterogram.  Given the short interval from her D&C unlikely significant pathology although no  guarantees.  At this point the patient is comfortable with observation.  Will call if she has continuing spotting.  Would like to continue on HRT.  We again discussed the risks versus benefits to include increased risk of thrombosis such as stroke heart attack DVT as well as the breast cancer issue.  Refill of her estradiol 1 mg and Prometrium 100 mg x 1 year. 2. Anxiety.  Continues on Paxil CR 12.5.  Would like to continue.  No side effects.  Refill x1 year provided. 3. Pap smear/HPV 2019.  No Pap smear done today.  No history of significant abnormal Pap smears.  Plan repeat Pap smear/HPV at 5-year interval per current screening guidelines. 4. DEXA 2017 normal.  Plan repeat DEXA at 5-year interval. 5. Colonoscopy due now and she is going to call Dr. Lorie Apley office to schedule. 6. Health maintenance.  No routine lab work done as patient does this elsewhere.  Follow-up 1 year, sooner as needed.   Anastasio Auerbach MD, 10:31 AM 01/23/2019

## 2019-01-23 NOTE — Patient Instructions (Signed)
Follow-up in 1 year for annual exam.  Call if you have any further bleeding

## 2019-02-06 DIAGNOSIS — M25512 Pain in left shoulder: Secondary | ICD-10-CM | POA: Diagnosis not present

## 2019-02-27 DIAGNOSIS — M47812 Spondylosis without myelopathy or radiculopathy, cervical region: Secondary | ICD-10-CM | POA: Diagnosis not present

## 2019-05-22 ENCOUNTER — Other Ambulatory Visit: Payer: Self-pay | Admitting: Obstetrics and Gynecology

## 2019-05-22 DIAGNOSIS — Z1231 Encounter for screening mammogram for malignant neoplasm of breast: Secondary | ICD-10-CM

## 2020-01-29 ENCOUNTER — Other Ambulatory Visit: Payer: Self-pay

## 2020-01-29 ENCOUNTER — Ambulatory Visit: Payer: BC Managed Care – PPO | Admitting: Obstetrics and Gynecology

## 2020-01-29 ENCOUNTER — Ambulatory Visit
Admission: RE | Admit: 2020-01-29 | Discharge: 2020-01-29 | Disposition: A | Discharge status: Home / Self Care | Payer: BC Managed Care – PPO | Source: Ambulatory Visit | Attending: Obstetrics and Gynecology | Admitting: Obstetrics and Gynecology

## 2020-01-29 ENCOUNTER — Encounter: Payer: Self-pay | Admitting: Obstetrics and Gynecology

## 2020-01-29 VITALS — BP 130/70 | Ht 64.0 in | Wt 161.0 lb

## 2020-01-29 DIAGNOSIS — Z1231 Encounter for screening mammogram for malignant neoplasm of breast: Secondary | ICD-10-CM

## 2020-01-29 DIAGNOSIS — Z79890 Hormone replacement therapy (postmenopausal): Secondary | ICD-10-CM

## 2020-01-29 DIAGNOSIS — F419 Anxiety disorder, unspecified: Secondary | ICD-10-CM

## 2020-01-29 DIAGNOSIS — Z01419 Encounter for gynecological examination (general) (routine) without abnormal findings: Secondary | ICD-10-CM | POA: Diagnosis not present

## 2020-01-29 MED ORDER — PROGESTERONE MICRONIZED 100 MG PO CAPS: 100.0000 mg | ORAL_CAPSULE | Freq: Every day | ORAL | 4 refills | Status: DC

## 2020-01-29 MED ORDER — ESTRADIOL 1 MG PO TABS: 1.0000 mg | ORAL_TABLET | Freq: Every day | ORAL | 4 refills | Status: DC

## 2020-01-29 MED ORDER — PAROXETINE HCL ER 12.5 MG PO TB24: 12.5000 mg | ORAL_TABLET | ORAL | 4 refills | Status: DC

## 2020-01-29 NOTE — Progress Notes (Signed)
Christina Church 21-Aug-1955 [id number]  SUBJECTIVE:  64 y.o. G1P1001 female for annual routine gynecologic exam and Pap smear. She has no gynecologic concerns.  Had been having more migraine headaches and her PCP started her on Imitrex.  She associated the headaches with moving households but since being done with that she has not had any further migraines.  Also having some occasional and sporadic left lower quadrant pains which resolve quickly.  She says this has been present ever since she had a colonoscopy and she recalls that she was told she might of had some colitis but has not been able to follow-up with GI about that.  Current Outpatient Medications  Medication Sig Dispense Refill  . cycloSPORINE, PF, (CEQUA) 0.09 % SOLN Apply to eye.    . estradiol (ESTRACE) 1 MG tablet Take 1 tablet (1 mg total) by mouth daily. 90 tablet 4  . pantoprazole (PROTONIX) 40 MG tablet Take 40 mg by mouth daily.    Marland Kitchen PARoxetine (PAXIL-CR) 12.5 MG 24 hr tablet Take 1 tablet (12.5 mg total) by mouth every morning. 90 tablet 4  . progesterone (PROMETRIUM) 100 MG capsule Take 1 capsule (100 mg total) by mouth at bedtime. 90 capsule 4  . SUMAtriptan (IMITREX) 50 MG tablet Take 50 mg by mouth every 2 (two) hours as needed for migraine. May repeat in 2 hours if headache persists or recurs.     No current facility-administered medications for this visit.   Allergies: Codeine-guaifenesin and Meclizine  Patient's last menstrual period was 11/13/2007.  Past medical history,surgical history, problem list, medications, allergies, family history and social history were all reviewed and documented as reviewed in the EPIC chart.  ROS: Pertinent positives and negatives as reviewed in HPI    OBJECTIVE:  Ht 5\' 4"  (1.626 m)   Wt 161 lb (73 kg)   LMP 11/13/2007   BMI 27.64 kg/m  The patient appears well, alert, oriented, in no distress. ENT normal.  Neck supple. No cervical or supraclavicular adenopathy or  thyromegaly.  Lungs are clear, good air entry, no wheezes, rhonchi or rales. S1 and S2 normal, no murmurs, regular rate and rhythm.  Abdomen soft without tenderness, guarding, mass or organomegaly.  Neurological is normal, no focal findings.  BREAST EXAM: breasts appear normal, no suspicious masses, no skin or nipple changes or axillary nodes  PELVIC EXAM: VULVA: normal appearing vulva with no masses, tenderness or lesions, VAGINA: normal appearing vagina with normal color and discharge, no lesions, CERVIX: normal appearing cervix without discharge or lesions, UTERUS: uterus is normal size, shape, consistency and nontender, ADNEXA: normal adnexa in size, nontender and no masses, some tenderness with deep palpation in the left lower pelvis  Chaperone: 11/15/2007 present during the examination  ASSESSMENT:  64 y.o. G1P1001 here for annual gynecologic exam  PLAN:   1. Postmenopausal-HRT.  Prior hysteroscopy D&C with resection of benign endometrial polyp 03/2018.  No further vaginal bleeding.  Recommend thinking about weaning down on her dose of estradiol, currently taking 1 mg daily with progesterone 100 mg at nighttime.  Could try cutting the tablet in half and taking 0.5 mg daily.  Good control of her symptoms on HRT.  We discussed that if her headaches start to come back more regularly then we really want to think about try to get her off of the estrogen.  Since the headaches have resolved for the time being it is okay to continue HRT for now.  Stands the risks of thrombotic diseases  such as heart attack, stroke, DVT, PE and the breast cancer issue and uterine cancer possibility.  Refill of estradiol 1 mg and Prometrium 100 mg x 1 year. 2. Pap smear/HPV 2019.  No significant history of abnormal Pap smears.  Next Pap smear due 2025 following the current guidelines recommending the 5 year interval. 3. Mammogram done today.  Normal breast exam today.   4. Anxiety.  Uses Paxil CR 12.5 mg daily and  would like to continue.  No unusual side effects.  Refill x1 year provided. 5.  LLQ pain.  I encouraged her to follow-up with GI regarding her recall of there being possibly some colitis.  No palpable adnexal abnormalities today.  I encouraged her to let us know if that pain gets more intense and/or consistent and we might want to just check a pelvic ultrasound to rule out adnexal pathology on the left side as well as any GI recommendations. 6. Colonoscopy 2021.  Recommended that she follow up at the recommended interval.  7. DEXA 2017.  Normal.  Next DEXA recommended 2022 at the 5-year interval. 8. Health maintenance.  No labs today as she normally has these completed elsewhere.  Return annually or sooner, prn.  Theresia Majors MD 01/29/20

## 2020-06-27 ENCOUNTER — Other Ambulatory Visit: Payer: Self-pay

## 2020-06-27 ENCOUNTER — Ambulatory Visit
Admission: EM | Admit: 2020-06-27 | Discharge: 2020-06-27 | Disposition: A | Discharge status: Home / Self Care | Payer: BC Managed Care – PPO | Attending: Family Medicine | Admitting: Family Medicine

## 2020-06-27 ENCOUNTER — Encounter: Payer: Self-pay | Admitting: Emergency Medicine

## 2020-06-27 DIAGNOSIS — A084 Viral intestinal infection, unspecified: Secondary | ICD-10-CM

## 2020-06-27 MED ORDER — DIPHENOXYLATE-ATROPINE 2.5-0.025 MG PO TABS: 1.0000 | ORAL_TABLET | Freq: Four times a day (QID) | ORAL | 0 refills | Status: DC | PRN

## 2020-06-27 MED ORDER — ONDANSETRON 4 MG PO TBDP: 4.0000 mg | ORAL_TABLET | Freq: Three times a day (TID) | ORAL | 0 refills | Status: DC | PRN

## 2020-06-27 NOTE — ED Triage Notes (Addendum)
Vomiting and diarrhea that starting Saturday night.  Has vomited 2 x in the past 24 hours and had diarrhea several times.  Has no appetite and is fatigued.  Needs a work note for today.

## 2020-06-27 NOTE — ED Provider Notes (Signed)
[facility]    CSN: [id number] Arrival date & time: 06/27/20  0908      History   Chief Complaint No chief complaint on file.   HPI Christina Church is a 65 y.o. female.   HPI  Patient presents today with symptoms including watery diarrhea, vomiting, sick stomach over the last 2 days.  Patient is unaware of any sick contacts.  Patient ate at a restaurant over the weekend and subsequently developed after mentioned symptoms however no one else that ate at the same restaurant developed a new GI illness.  Patient has been without fever.  She was able to tolerate a grill cheese sandwich yesterday evening and is tolerating fluids.  Denies any URI symptoms.  She has not attempted relief with any OTC medications.  Past Medical History:  Diagnosis Date  . Migraines   . Post-menopausal bleeding 01/2018    Patient Active Problem List   Diagnosis Date Noted  . Adjustment disorder with depressed mood 12/17/2014  . External hemorrhoid 11/07/2012  . Tiredness 10/25/2011  . Weight gain 10/25/2011  . Postmenopausal 10/13/2010    Past Surgical History:  Procedure Laterality Date  . CARPAL TUNNEL RELEASE     right and left hand  . COLON BIOPSY    . DILATATION & CURETTAGE/HYSTEROSCOPY WITH MYOSURE N/A 04/04/2018   Procedure: DILATATION & CURETTAGE/HYSTEROSCOPY WITH MYOSURE;  Surgeon: Dara Lords, MD;  Location: [facility];  Service: Gynecology;  Laterality: N/A;  request 7:30am OR time in [facility] Block requests one hour OR time    OB History    Gravida  1   Para  1   Term  1   Preterm      AB      Living  1     SAB      IAB      Ectopic      Multiple      Live Births  1            Home Medications    Prior to Admission medications   Medication Sig Start Date End Date Taking? Authorizing Provider  Acetaminophen (TYLENOL PO) Take by mouth.    [provider]  cycloSPORINE, PF, (CEQUA) 0.09 % SOLN Apply to  eye.    [provider]  estradiol (ESTRACE) 1 MG tablet Take 1 tablet (1 mg total) by mouth daily. 01/29/20   Theresia Majors, MD  pantoprazole (PROTONIX) 40 MG tablet Take 40 mg by mouth daily.    [provider]  PARoxetine (PAXIL-CR) 12.5 MG 24 hr tablet Take 1 tablet (12.5 mg total) by mouth every morning. 01/29/20   Theresia Majors, MD  progesterone (PROMETRIUM) 100 MG capsule Take 1 capsule (100 mg total) by mouth at bedtime. 01/23/19   Fontaine, Nadyne Coombes, MD  progesterone (PROMETRIUM) 100 MG capsule Take 1 capsule (100 mg total) by mouth daily. 01/29/20   Theresia Majors, MD  SUMAtriptan (IMITREX) 50 MG tablet Take 50 mg by mouth every 2 (two) hours as needed for migraine. May repeat in 2 hours if headache persists or recurs.    [provider]  VITAMIN D PO Take by mouth.    [provider]    Family History Family History  Problem Relation Age of Onset  . Diabetes Mother   . Osteoporosis Mother   . Lung cancer Mother   . Stroke Mother   . Hypertension Father   . Lung cancer Father   .  Stroke Father   . Breast cancer Maternal Aunt 37  . Cancer Maternal Aunt        BONE AND LUNG    Social History Social History   Tobacco Use  . Smoking status: Never Smoker  . Smokeless tobacco: Never Used  Vaping Use  . Vaping Use: Never used  Substance Use Topics  . Alcohol use: No    Alcohol/week: 0.0 standard drinks  . Drug use: No     Allergies   Codeine-guaifenesin and Meclizine   Review of Systems Review of Systems Pertinent negatives listed in HPI   Physical Exam Triage Vital Signs ED Triage Vitals [06/27/20 0938]  Enc Vitals Group     BP (!) 147/80     Pulse Rate 90     Resp 19     Temp 98 F (36.7 C)     Temp Source Oral     SpO2 96 %     Weight      Height      Head Circumference      Peak Flow      Pain Score 0     Pain Loc      Pain Edu?      Excl. in GC?    No data found.  Updated Vital Signs BP (!)  147/80 (BP Location: Right Arm)   Pulse 90   Temp 98 F (36.7 C) (Oral)   Resp 19   LMP 11/13/2007   SpO2 96%   Visual Acuity Right Eye Distance:   Left Eye Distance:   Bilateral Distance:    Right Eye Near:   Left Eye Near:    Bilateral Near:     Physical Exam General appearance: alert, well developed, well nourished Head: Normocephalic, without obvious abnormality, atraumatic Respiratory: Respirations even and unlabored, normal respiratory rate Heart: Rate and rhythm normal. No gallop or murmurs noted on exam  Abdomen: BS hyperactive, no distention, no rebound tenderness, or no mass Extremities: No gross deformities Skin: Skin color, texture, turgor normal. No rashes seen  Psych: Appropriate mood and affect. Neurologic: GCS 15, normal coordination, normal gait   UC Treatments / Results  Labs (all labs ordered are listed, but only abnormal results are displayed) Labs Reviewed - No data to display  EKG   Radiology No results found.  Procedures Procedures (including critical care time)  Medications Ordered in UC Medications - No data to display  Initial Impression / Assessment and Plan / UC Course  I have reviewed the triage vital signs and the nursing notes.  Pertinent labs & imaging results that were available during my care of the patient were reviewed by me and considered in my medical decision making (see chart for details).    Viral gastroenteritis , self limiting illness, with symptom management warranted. Treatment per discharge instruction. Work note excuse provided. Strict ER/ return precautions given.  Final Clinical Impressions(s) / UC Diagnoses   Final diagnoses:  Viral gastroenteritis   Discharge Instructions   None    ED Prescriptions    Medication Sig Dispense Auth. Provider   ondansetron (ZOFRAN ODT) 4 MG disintegrating tablet Take 1 tablet (4 mg total) by mouth every 8 (eight) hours as needed for nausea or vomiting. 20 tablet Bing Neighbors, FNP   diphenoxylate-atropine (LOMOTIL) 2.5-0.025 MG tablet Take 1-2 tablets by mouth 4 (four) times daily as needed for diarrhea or loose stools. 20 tablet Bing Neighbors, FNP     PDMP not reviewed this  encounter.   Bing Neighbors, FNP 06/27/20 1055

## 2020-09-17 ENCOUNTER — Ambulatory Visit
Admission: EM | Admit: 2020-09-17 | Discharge: 2020-09-17 | Disposition: A | Discharge status: Home / Self Care | Payer: BC Managed Care – PPO | Attending: Emergency Medicine | Admitting: Emergency Medicine

## 2020-09-17 DIAGNOSIS — H8110 Benign paroxysmal vertigo, unspecified ear: Secondary | ICD-10-CM

## 2020-09-17 DIAGNOSIS — R110 Nausea: Secondary | ICD-10-CM

## 2020-09-17 MED ORDER — MECLIZINE HCL 12.5 MG PO TABS: 12.5000 mg | ORAL_TABLET | Freq: Three times a day (TID) | ORAL | 0 refills | Status: DC | PRN

## 2020-09-17 MED ORDER — ONDANSETRON HCL 4 MG PO TABS: 4.0000 mg | ORAL_TABLET | Freq: Four times a day (QID) | ORAL | 0 refills | Status: DC

## 2020-09-17 NOTE — ED Provider Notes (Signed)
[facility]   [id number] 09/17/20 Arrival Time: 0174  BS:[id number]  SUBJECTIVE:  Christina Church is a 65 y.o. female who presents with complaint of intermittent dizziness/ vertigo for the past 2 days.  Bending down at work prior to symptoms.  Describes the dizziness as "the room spinning." States that it is intermittent with episodes lasting unsure of length of time.  Has NOT tried OTC medications without relief.  Symptoms made worse with position changes.  Admits to previous symptoms in the past with vertigo.  Denies fever, chills, vomiting, hearing changes, tinnitus, ear pain, chest pain, syncope, SOB, weakness, slurred speech, memory or emotional changes, facial drooping/ asymmetry, incoordination, numbness or tingling, abdominal pain, changes in bowel or bladder habits.    ROS: As per HPI.  All other pertinent ROS negative.    Past Medical History:  Diagnosis Date   Migraines    Post-menopausal bleeding 01/2018   Past Surgical History:  Procedure Laterality Date   CARPAL TUNNEL RELEASE     right and left hand   COLON BIOPSY     DILATATION & CURETTAGE/HYSTEROSCOPY WITH MYOSURE N/A 04/04/2018   Procedure: DILATATION & CURETTAGE/HYSTEROSCOPY WITH MYOSURE;  Surgeon: Dara Lords, MD;  Location: [facility];  Service: Gynecology;  Laterality: N/A;  request 7:30am OR time in [place] Block requests one hour OR time   Allergies  Allergen Reactions   Codeine-Guaifenesin Other (See Comments)    "Disoriented"   Meclizine     Disoriented   No current facility-administered medications on file prior to encounter.   Current Outpatient Medications on File Prior to Encounter  Medication Sig Dispense Refill   Acetaminophen (TYLENOL PO) Take by mouth.     cycloSPORINE, PF, (CEQUA) 0.09 % SOLN Apply to eye.     diphenoxylate-atropine (LOMOTIL) 2.5-0.025 MG tablet Take 1-2 tablets by mouth 4 (four) times daily as needed for diarrhea or loose stools. 20  tablet 0   estradiol (ESTRACE) 1 MG tablet Take 1 tablet (1 mg total) by mouth daily. 90 tablet 4   pantoprazole (PROTONIX) 40 MG tablet Take 40 mg by mouth daily.     PARoxetine (PAXIL-CR) 12.5 MG 24 hr tablet Take 1 tablet (12.5 mg total) by mouth every morning. 90 tablet 4   progesterone (PROMETRIUM) 100 MG capsule Take 1 capsule (100 mg total) by mouth at bedtime. 90 capsule 4   progesterone (PROMETRIUM) 100 MG capsule Take 1 capsule (100 mg total) by mouth daily. 90 capsule 4   SUMAtriptan (IMITREX) 50 MG tablet Take 50 mg by mouth every 2 (two) hours as needed for migraine. May repeat in 2 hours if headache persists or recurs.     VITAMIN D PO Take by mouth.     Social History   Socioeconomic History   Marital status: Married    Spouse name: Not on file   Number of children: Not on file   Years of education: Not on file   Highest education level: Not on file  Occupational History   Not on file  Tobacco Use   Smoking status: Never   Smokeless tobacco: Never  Vaping Use   Vaping Use: Never used  Substance and Sexual Activity   Alcohol use: No    Alcohol/week: 0.0 standard drinks   Drug use: No   Sexual activity: Not Currently    Comment: 1st intercourse 65 yo-Fewer than 5 partners  Other Topics Concern   Not on file  Social History Narrative  Not on file   Social Determinants of Health   Financial Resource Strain: Not on file  Food Insecurity: Not on file  Transportation Needs: Not on file  Physical Activity: Not on file  Stress: Not on file  Social Connections: Not on file  Intimate Partner Violence: Not on file   Family History  Problem Relation Age of Onset   Diabetes Mother    Osteoporosis Mother    Lung cancer Mother    Stroke Mother    Hypertension Father    Lung cancer Father    Stroke Father    Breast cancer Maternal Aunt 44   Cancer Maternal Aunt        BONE AND LUNG    OBJECTIVE:  Vitals:   09/17/20 0941  BP: 134/73  Pulse: (!) 54  Resp:  18  Temp: 98.3 F (36.8 C)  SpO2: 98%    General appearance: alert; no distress Eyes: PERRLA; EOMI; conjunctiva normal HENT: normocephalic; atraumatic; TMs normal; nasal mucosa normal; oral mucosa normal Neck: supple with FROM Lungs: clear to auscultation bilaterally Heart: regular rate and rhythm Abdomen: soft, non-tender; bowel sounds normal Extremities: no cyanosis or edema; symmetrical with no gross deformities Skin: warm and dry Neurologic: normal gait; strength equal and intact bilaterally about the upper and lower extremities; negative pronator drift; finger to nose without difficulty; CN 2-12 grossly intact Psychological: alert and cooperative; normal mood and affect  ASSESSMENT & PLAN:  1. Benign paroxysmal positional vertigo, unspecified laterality   2. Nausea without vomiting     Meds ordered this encounter  Medications   ondansetron (ZOFRAN) 4 MG tablet    Sig: Take 1 tablet (4 mg total) by mouth every 6 (six) hours.    Dispense:  12 tablet    Refill:  0    Order Specific Question:   Supervising Provider    Answer:   Eustace Moore [[id number]]   meclizine (ANTIVERT) 12.5 MG tablet    Sig: Take 1 tablet (12.5 mg total) by mouth 3 (three) times daily as needed for dizziness.    Dispense:  30 tablet    Refill:  0    Order Specific Question:   Supervising Provider    Answer:   Eustace Moore [[id number]]   Meclizine prescribed.  Take as directed for symptomatic relief.  This medication may make you drowsy so use with cautions while driving or operating heavy machinery. Zofran prescribed.  Take as needed for nausea Follow up with PCP if symptoms persists Return or go to the ER if you have any new or worsening symptoms   Reviewed expectations re: course of current medical issues. Questions answered. Outlined signs and symptoms indicating need for more acute intervention. Patient verbalized understanding. After Visit Summary given.     Rennis Harding,  PA-C 09/17/20 1008

## 2020-09-17 NOTE — ED Triage Notes (Signed)
Pt also nauseated

## 2020-09-17 NOTE — Discharge Instructions (Addendum)
Meclizine prescribed.  Take as directed for symptomatic relief.  This medication may make you drowsy so use with cautions while driving or operating heavy machinery. Zofran prescribed.  Take as needed for nausea Follow up with PCP if symptoms persists Return or go to the ER if you have any new or worsening symptoms

## 2020-09-17 NOTE — ED Triage Notes (Signed)
Pt presents with c/o dizziness that began last night, worse with movement . Pt has h/o vertigo

## 2020-11-18 ENCOUNTER — Other Ambulatory Visit: Payer: Self-pay | Admitting: Obstetrics and Gynecology

## 2020-11-18 DIAGNOSIS — Z1231 Encounter for screening mammogram for malignant neoplasm of breast: Secondary | ICD-10-CM

## 2021-02-01 ENCOUNTER — Other Ambulatory Visit: Payer: Self-pay

## 2021-02-01 ENCOUNTER — Encounter: Payer: Self-pay | Admitting: Anesthesiology

## 2021-02-01 ENCOUNTER — Encounter: Payer: Self-pay | Admitting: Obstetrics and Gynecology

## 2021-02-01 ENCOUNTER — Ambulatory Visit (INDEPENDENT_AMBULATORY_CARE_PROVIDER_SITE_OTHER): Payer: Managed Care, Other (non HMO) | Admitting: Obstetrics and Gynecology

## 2021-02-01 VITALS — BP 138/80 | HR 101 | Ht 63.0 in | Wt 165.0 lb

## 2021-02-01 DIAGNOSIS — Z23 Encounter for immunization: Secondary | ICD-10-CM

## 2021-02-01 DIAGNOSIS — Z01419 Encounter for gynecological examination (general) (routine) without abnormal findings: Secondary | ICD-10-CM

## 2021-02-01 MED ORDER — PAROXETINE HCL ER 12.5 MG PO TB24: ORAL_TABLET | ORAL | 4 refills | Status: DC

## 2021-02-01 MED ORDER — PROGESTERONE MICRONIZED 100 MG PO CAPS: 100.0000 mg | ORAL_CAPSULE | Freq: Every day | ORAL | 4 refills | Status: DC

## 2021-02-01 MED ORDER — ESTRADIOL 0.5 MG PO TABS: ORAL_TABLET | ORAL | 4 refills | Status: DC

## 2021-02-01 NOTE — Patient Instructions (Signed)

## 2021-02-01 NOTE — Progress Notes (Signed)
65 y.o. G31P1001 Married Caucasian female here for annual exam.    Patient stopped HRT for one month in September. She had some mood changes and lack of patience.  She went back on them.   Followed for anxiety and taking Paxil CR 12. 5 mg. Working well.   Patient fell 01-27-21 and has bruise under her right breast. Saw her PCP and did not break anything.  PCP: Dwana Melena, MD   Patient's last menstrual period was 11/13/2007.           Sexually active: No.  The current method of family planning is post menopausal status.    Exercising: No.   walking Smoker:  no  Health Maintenance: Pap: 01-03-18 Neg:Neg HR HPV, 11-19-13 Neg:Neg HR HPV History of abnormal Pap:  no MMG:  01-29-20 3D/neg/BiRads1. Appt. tomorrow Colonoscopy:  07-24-19 polyps;next 7-10 years BMD:  2017  Result :Normal TDaP:  11-07-12 Gardasil:   no HIV:no Hep C: 2014 Neg Screening Labs:  PCP. Flu vaccine:  today.   reports that she has never smoked. She has never used smokeless tobacco. She reports that she does not drink alcohol and does not use drugs.  Past Medical History:  Diagnosis Date   Migraines    Post-menopausal bleeding 01/2018    Past Surgical History:  Procedure Laterality Date   CARPAL TUNNEL RELEASE     right and left hand   COLON BIOPSY     DILATATION & CURETTAGE/HYSTEROSCOPY WITH MYOSURE N/A 04/04/2018   Procedure: DILATATION & CURETTAGE/HYSTEROSCOPY WITH MYOSURE;  Surgeon: Dara Lords, MD;  Location: [facility];  Service: Gynecology;  Laterality: N/A;  request 7:30am OR time in [facility] Block requests one hour OR time    Current Outpatient Medications  Medication Sig Dispense Refill   Acetaminophen (TYLENOL PO) Take by mouth.     cycloSPORINE (RESTASIS) 0.05 % ophthalmic emulsion      estradiol (ESTRACE) 1 MG tablet Take 1 tablet (1 mg total) by mouth daily. 90 tablet 4   pantoprazole (PROTONIX) 40 MG tablet Take 40 mg by mouth daily.     PARoxetine (PAXIL-CR)  12.5 MG 24 hr tablet 1 tab(s) orally once a day (in the morning)     progesterone (PROMETRIUM) 100 MG capsule Take 1 capsule (100 mg total) by mouth daily. 90 capsule 4   SUMAtriptan (IMITREX) 50 MG tablet Take 50 mg by mouth every 2 (two) hours as needed for migraine. May repeat in 2 hours if headache persists or recurs.     VITAMIN D PO Take by mouth.     XIIDRA 5 % SOLN Apply 1 drop to eye 2 (two) times daily.     No current facility-administered medications for this visit.    Family History  Problem Relation Age of Onset   Diabetes Mother    Osteoporosis Mother    Lung cancer Mother    Stroke Mother    Hypertension Father    Lung cancer Father    Stroke Father    Breast cancer Maternal Aunt 48   Cancer Maternal Aunt        BONE AND LUNG    Review of Systems  All other systems reviewed and are negative.  Exam:   BP 138/80   Pulse (!) 101   Ht 5\' 3"  (1.6 m)   Wt 165 lb (74.8 kg)   LMP 11/13/2007   SpO2 98%   BMI 29.23 kg/m     General appearance: alert, cooperative and  appears stated age Head: normocephalic, without obvious abnormality, atraumatic Neck: no adenopathy, supple, symmetrical, trachea midline and thyroid normal to inspection and palpation Lungs: clear to auscultation bilaterally Breasts: normal appearance, no masses or tenderness, No nipple retraction or dimpling, No nipple discharge or bleeding, No axillary adenopathy Heart: regular rate and rhythm Abdomen: soft, non-tender; no masses, no organomegaly Extremities: extremities normal, atraumatic, no cyanosis or edema Skin: skin color, texture, turgor normal. No rashes or lesions Lymph nodes: cervical, supraclavicular, and axillary nodes normal. Neurologic: grossly normal  Pelvic: External genitalia:  no lesions              No abnormal inguinal nodes palpated.              Urethra:  normal appearing urethra with no masses, tenderness or lesions              Bartholins and Skenes: normal                  Vagina: normal appearing vagina with normal color and discharge, no lesions              Cervix: no lesions              Pap taken: No. Bimanual Exam:  Uterus:  normal size, contour, position, consistency, mobility, non-tender              Adnexa: no mass, fullness, tenderness              Rectal exam: yes.  Confirms.              Anus:  normal sphincter tone, no lesions  Chaperone was present for exam:  yes.  Assessment:   Well woman visit with gynecologic exam. HRT.  Anxiety.   Plan: Mammogram screening discussed. Self breast awareness reviewed. Pap and HR HPV 2024. Guidelines for Calcium, Vitamin D, regular exercise program including cardiovascular and weight bearing exercise. Discused WHI and use of HRT which can increase risk of PE, DVT, MI, stroke and breast cancer.  Will start weaning off HRT by reducing Estrace to 0.5 mg daily and continuing Prometrium 100 mg q hs.  Refills for one year.  I instructed her that she may stop the hormones after 3 months if she wishes, but she may still need to wean down further on the estrogen for this to be successful without a lot of rebound symptoms.  Continue Paxil CR 12.5 mg daily for one year.   We discussed the benefit of Paxil to treat hot flashes.  Flu vaccine.  I would check her BMD again after she has come off her HRT.  Follow up annually and prn.    After visit summary provided.

## 2021-02-02 ENCOUNTER — Ambulatory Visit: Payer: BC Managed Care – PPO | Admitting: Obstetrics and Gynecology

## 2021-02-02 ENCOUNTER — Ambulatory Visit
Admission: RE | Admit: 2021-02-02 | Discharge: 2021-02-02 | Disposition: A | Discharge status: Home / Self Care | Payer: Managed Care, Other (non HMO) | Source: Ambulatory Visit | Attending: Obstetrics and Gynecology | Admitting: Obstetrics and Gynecology

## 2021-02-02 DIAGNOSIS — Z1231 Encounter for screening mammogram for malignant neoplasm of breast: Secondary | ICD-10-CM

## 2021-03-10 ENCOUNTER — Ambulatory Visit
Admission: EM | Admit: 2021-03-10 | Discharge: 2021-03-10 | Disposition: A | Discharge status: Home / Self Care | Payer: Managed Care, Other (non HMO)

## 2021-03-10 ENCOUNTER — Other Ambulatory Visit: Payer: Self-pay

## 2021-03-10 DIAGNOSIS — Z1152 Encounter for screening for COVID-19: Secondary | ICD-10-CM | POA: Diagnosis not present

## 2021-03-10 DIAGNOSIS — J029 Acute pharyngitis, unspecified: Secondary | ICD-10-CM

## 2021-03-10 NOTE — Discharge Instructions (Signed)
FLu and covid are pending

## 2021-03-10 NOTE — ED Triage Notes (Signed)
Pt presents with c/o sore throat and dry cough that began yesterday

## 2021-03-10 NOTE — ED Provider Notes (Signed)
[facility]    CSN: [id number] Arrival date & time: 03/10/21  0801      History   Chief Complaint Chief Complaint  Patient presents with   Sore Throat   Cough    HPI Christina Church is a 65 y.o. female.   Pt complains of a sore throat.   The history is provided by the patient. No language interpreter was used.  Sore Throat This is a new problem. The problem occurs constantly. The problem has been rapidly worsening. Nothing aggravates the symptoms. Nothing relieves the symptoms. She has tried nothing for the symptoms. The treatment provided no relief.  Cough  Past Medical History:  Diagnosis Date   Migraines    Post-menopausal bleeding 01/2018    Patient Active Problem List   Diagnosis Date Noted   Adjustment disorder with depressed mood 12/17/2014   External hemorrhoid 11/07/2012   Tiredness 10/25/2011   Weight gain 10/25/2011   Postmenopausal 10/13/2010    Past Surgical History:  Procedure Laterality Date   CARPAL TUNNEL RELEASE     right and left hand   COLON BIOPSY     DILATATION & CURETTAGE/HYSTEROSCOPY WITH MYOSURE N/A 04/04/2018   Procedure: DILATATION & CURETTAGE/HYSTEROSCOPY WITH MYOSURE;  Surgeon: Anastasio Auerbach, MD;  Location: [facility];  Service: Gynecology;  Laterality: N/A;  request 7:30am OR time in [place] requests one hour OR time    OB History     Gravida  1   Para  1   Term  1   Preterm      AB      Living  1      SAB      IAB      Ectopic      Multiple      Live Births  1            Home Medications    Prior to Admission medications   Medication Sig Start Date End Date Taking? Authorizing Provider  Acetaminophen (TYLENOL PO) Take by mouth.    [provider]  cycloSPORINE (RESTASIS) 0.05 % ophthalmic emulsion  12/25/15   [provider]  estradiol (ESTRACE) 0.5 MG tablet Take one tablet (0.5 mg) by mouth daily. 02/01/21   Nunzio Cobbs, MD  levothyroxine (SYNTHROID) 25 MCG tablet Take 25 mcg by mouth daily. 03/04/21   [provider]  pantoprazole (PROTONIX) 40 MG tablet Take 40 mg by mouth daily.    [provider]  PARoxetine (PAXIL-CR) 12.5 MG 24 hr tablet 1 tab(s) orally once a day (in the morning) 02/01/21   Nunzio Cobbs, MD  progesterone (PROMETRIUM) 100 MG capsule Take 1 capsule (100 mg total) by mouth daily. Take at bedtime. 02/01/21   Nunzio Cobbs, MD  SUMAtriptan (IMITREX) 50 MG tablet Take 50 mg by mouth every 2 (two) hours as needed for migraine. May repeat in 2 hours if headache persists or recurs.    [provider]  VITAMIN D PO Take by mouth.    [provider]  XIIDRA 5 % SOLN Apply 1 drop to eye 2 (two) times daily. 09/12/20   [provider]    Family History Family History  Problem Relation Age of Onset   Diabetes Mother    Osteoporosis Mother    Lung cancer Mother    Stroke Mother    Hypertension Father    Lung cancer Father  Stroke Father    Breast cancer Maternal Aunt 74   Cancer Maternal Aunt        BONE AND LUNG    Social History Social History   Tobacco Use   Smoking status: Never   Smokeless tobacco: Never  Vaping Use   Vaping Use: Never used  Substance Use Topics   Alcohol use: No    Alcohol/week: 0.0 standard drinks   Drug use: No     Allergies   Codeine-guaifenesin and Meclizine   Review of Systems Review of Systems  Respiratory:  Positive for cough.   All other systems reviewed and are negative.   Physical Exam Triage Vital Signs ED Triage Vitals  Enc Vitals Group     BP 03/10/21 0843 136/83     Pulse Rate 03/10/21 0843 73     Resp 03/10/21 0843 20     Temp 03/10/21 0843 98.5 F (36.9 C)     Temp src --      SpO2 03/10/21 0843 97 %     Weight --      Height --      Head Circumference --      Peak Flow --      Pain Score 03/10/21 0841 9     Pain Loc --      Pain Edu? --       Excl. in GC? --    No data found.  Updated Vital Signs BP 136/83    Pulse 73    Temp 98.5 F (36.9 C)    Resp 20    LMP 11/13/2007    SpO2 97%   Visual Acuity Right Eye Distance:   Left Eye Distance:   Bilateral Distance:    Right Eye Near:   Left Eye Near:    Bilateral Near:     Physical Exam Vitals reviewed.  Constitutional:      Appearance: She is well-developed.  HENT:     Head: Normocephalic.     Right Ear: Tympanic membrane normal.     Left Ear: Tympanic membrane normal.     Mouth/Throat:     Mouth: Mucous membranes are moist.     Pharynx: Posterior oropharyngeal erythema present.     Tonsils: No tonsillar exudate.  Cardiovascular:     Rate and Rhythm: Normal rate.  Abdominal:     Palpations: Abdomen is soft.  Musculoskeletal:     Cervical back: Normal range of motion.  Skin:    General: Skin is warm.  Neurological:     Mental Status: She is alert.  Psychiatric:        Mood and Affect: Mood normal.     UC Treatments / Results  Labs (all labs ordered are listed, but only abnormal results are displayed) Labs Reviewed  COVID-19, FLU A+B NAA    EKG   Radiology No results found.  Procedures Procedures (including critical care time)  Medications Ordered in UC Medications - No data to display  Initial Impression / Assessment and Plan / UC Course  I have reviewed the triage vital signs and the nursing notes.  Pertinent labs & imaging results that were available during my care of the patient were reviewed by me and considered in my medical decision making (see chart for details).     MDM:  flu and covid pending  Final Clinical Impressions(s) / UC Diagnoses   Final diagnoses:  Acute pharyngitis, unspecified etiology     Discharge Instructions  FLu and covid are pending   ED Prescriptions   None    PDMP not reviewed this encounter. An After Visit Summary was printed and given to the patient.    Fransico Meadow, [place] 03/10/21  [phone]

## 2021-03-11 LAB — COVID-19, FLU A+B NAA
Influenza A, NAA: NOT DETECTED
Influenza B, NAA: NOT DETECTED
SARS-CoV-2, NAA: NOT DETECTED

## 2021-07-07 DIAGNOSIS — R7309 Other abnormal glucose: Secondary | ICD-10-CM

## 2021-07-07 HISTORY — DX: Other abnormal glucose: R73.09

## 2021-09-07 ENCOUNTER — Other Ambulatory Visit: Payer: Self-pay | Admitting: Obstetrics and Gynecology

## 2021-09-07 DIAGNOSIS — Z1231 Encounter for screening mammogram for malignant neoplasm of breast: Secondary | ICD-10-CM

## 2022-02-13 NOTE — Progress Notes (Signed)
66 y.o. G63P1001 Married Caucasian female here for annual exam.    Taking her HRT.   Taking Paxil CR and anxiety is controlled.  Denies depression.  Going to retire.   PCP:   Christina Sells, MD  Patient's last menstrual period was 11/13/2007.           Sexually active: No.  The current method of family planning is post menopausal status.    Exercising: No.   Smoker:  no  Health Maintenance: Pap:  01-03-18 Neg:Neg HR HPV, 11-19-13 Neg:Neg HR HPV  History of abnormal Pap:  no MMG:  02/15/22 (done today) Colonoscopy:  07/24/19 polyps, next 7-10 years BMD:   01/19/16  Result  normal TDaP:  11/07/12 Gardasil:   no HIV: no Hep C: 2014, Negative Screening Labs:  PCP Flu vaccine:  recommended.  Covid vaccine:  mentioned.   reports that she has never smoked. She has never used smokeless tobacco. She reports that she does not drink alcohol and does not use drugs.  Past Medical History:  Diagnosis Date   Migraines    Post-menopausal bleeding 01/2018    Past Surgical History:  Procedure Laterality Date   CARPAL TUNNEL RELEASE     right and left hand   COLON BIOPSY     DILATATION & CURETTAGE/HYSTEROSCOPY WITH MYOSURE N/A 04/04/2018   Procedure: DILATATION & CURETTAGE/HYSTEROSCOPY WITH MYOSURE;  Surgeon: Dara Lords, MD;  Location: [facility];  Service: Gynecology;  Laterality: N/A;  request 7:30am OR time in [facility] Block requests one hour OR time    Current Outpatient Medications  Medication Sig Dispense Refill   Acetaminophen (TYLENOL PO) Take by mouth.     cycloSPORINE (RESTASIS) 0.05 % ophthalmic emulsion      estradiol (ESTRACE) 0.5 MG tablet Take one tablet (0.5 mg) by mouth daily. 90 tablet 4   levothyroxine (SYNTHROID) 25 MCG tablet Take 25 mcg by mouth daily.     pantoprazole (PROTONIX) 40 MG tablet Take 40 mg by mouth daily.     PARoxetine (PAXIL-CR) 12.5 MG 24 hr tablet 1 tab(s) orally once a day (in the morning) 90 tablet 4   progesterone  (PROMETRIUM) 100 MG capsule Take 1 capsule (100 mg total) by mouth daily. Take at bedtime. 90 capsule 4   SUMAtriptan (IMITREX) 50 MG tablet Take 50 mg by mouth every 2 (two) hours as needed for migraine. May repeat in 2 hours if headache persists or recurs.     VITAMIN D PO Take by mouth.     XIIDRA 5 % SOLN Apply 1 drop to eye 2 (two) times daily.     No current facility-administered medications for this visit.    Family History  Problem Relation Age of Onset   Diabetes Mother    Osteoporosis Mother    Lung cancer Mother    Stroke Mother    Hypertension Father    Lung cancer Father    Stroke Father    Breast cancer Maternal Aunt 57   Cancer Maternal Aunt        BONE AND LUNG    Review of Systems  All other systems reviewed and are negative.   Exam:   BP 131/79 (BP Location: Left Arm, Patient Position: Sitting, Cuff Size: Normal)   Ht 5\' 3"  (1.6 m)   Wt 164 lb (74.4 kg)   LMP 11/13/2007   BMI 29.05 kg/m     General appearance: alert, cooperative and appears stated age Head: normocephalic, without obvious  abnormality, atraumatic Neck: no adenopathy, supple, symmetrical, trachea midline and thyroid normal to inspection and palpation Lungs: clear to auscultation bilaterally Breasts: normal appearance, no masses or tenderness, No nipple retraction or dimpling, No nipple discharge or bleeding, No axillary adenopathy Heart: regular rate and rhythm Abdomen: soft, non-tender; no masses, no organomegaly Extremities: extremities normal, atraumatic, no cyanosis or edema Skin: skin color, texture, turgor normal. No rashes or lesions Lymph nodes: cervical, supraclavicular, and axillary nodes normal. Neurologic: grossly normal  Pelvic: External genitalia:  no lesions              No abnormal inguinal nodes palpated.              Urethra:  normal appearing urethra with no masses, tenderness or lesions              Bartholins and Skenes: normal                 Vagina: Spots of  erythema of posterior cervix and posterior vaginal cuff.               Cervix: no lesions.  Spots of erythema of posterior cervix and posterior vaginal cuff.               Pap taken: yes Bimanual Exam:  Uterus:  normal size, contour, position, consistency, mobility, non-tender              Adnexa: no mass, fullness, tenderness              Rectal exam: yes.  Confirms.              Anus:  normal sphincter tone, no lesions  Chaperone was present for exam:  Irving Burton.  Assessment:   Well woman visit with gynecologic exam. HRT.  Anxiety.  Cervical and vaginal erythema.  Atrophy versus lesion.  Plan: Mammogram screening discussed. Self breast awareness reviewed. Pap and HR HPV collected. Guidelines for Calcium, Vitamin D, regular exercise program including cardiovascular and weight bearing exercise. Discused WHI and use of HRT which can increase risk of PE, DVT, MI, stroke and breast cancer.  Decreased Estradiol to 0.5 mg every other day and Prometrium 100 mg q hs for 3 months, and then stop medication.   Continue Paxil CR 12.5 mg daily.  Follow up annually and prn.   After visit summary provided.

## 2022-02-15 ENCOUNTER — Other Ambulatory Visit (HOSPITAL_COMMUNITY)
Admission: RE | Admit: 2022-02-15 | Discharge: 2022-02-15 | Disposition: A | Discharge status: Home / Self Care | Payer: Managed Care, Other (non HMO) | Source: Ambulatory Visit | Attending: Obstetrics and Gynecology | Admitting: Obstetrics and Gynecology

## 2022-02-15 ENCOUNTER — Encounter: Payer: Self-pay | Admitting: Obstetrics and Gynecology

## 2022-02-15 ENCOUNTER — Ambulatory Visit
Admission: RE | Admit: 2022-02-15 | Discharge: 2022-02-15 | Disposition: A | Discharge status: Home / Self Care | Payer: Managed Care, Other (non HMO) | Source: Ambulatory Visit | Attending: Obstetrics and Gynecology | Admitting: Obstetrics and Gynecology

## 2022-02-15 ENCOUNTER — Ambulatory Visit: Payer: Managed Care, Other (non HMO) | Admitting: Obstetrics and Gynecology

## 2022-02-15 VITALS — BP 131/79 | Ht 63.0 in | Wt 164.0 lb

## 2022-02-15 DIAGNOSIS — Z01419 Encounter for gynecological examination (general) (routine) without abnormal findings: Secondary | ICD-10-CM

## 2022-02-15 DIAGNOSIS — Z124 Encounter for screening for malignant neoplasm of cervix: Secondary | ICD-10-CM | POA: Diagnosis present

## 2022-02-15 DIAGNOSIS — Z1231 Encounter for screening mammogram for malignant neoplasm of breast: Secondary | ICD-10-CM

## 2022-02-15 MED ORDER — ESTRADIOL 0.5 MG PO TABS: ORAL_TABLET | ORAL | 0 refills | Status: DC

## 2022-02-15 MED ORDER — PROGESTERONE MICRONIZED 100 MG PO CAPS: 100.0000 mg | ORAL_CAPSULE | Freq: Every day | ORAL | 0 refills | Status: DC

## 2022-02-15 MED ORDER — PAROXETINE HCL ER 12.5 MG PO TB24: ORAL_TABLET | ORAL | 3 refills | Status: DC

## 2022-02-15 NOTE — Patient Instructions (Signed)

## 2022-02-16 ENCOUNTER — Encounter: Payer: Self-pay | Admitting: Obstetrics and Gynecology

## 2022-02-19 LAB — CYTOLOGY - PAP
Comment: NEGATIVE
Diagnosis: UNDETERMINED — AB
High risk HPV: NEGATIVE

## 2022-03-25 ENCOUNTER — Encounter: Payer: Self-pay | Admitting: Obstetrics and Gynecology

## 2022-06-25 ENCOUNTER — Other Ambulatory Visit: Payer: Self-pay

## 2022-06-25 MED ORDER — PAROXETINE HCL ER 12.5 MG PO TB24: ORAL_TABLET | ORAL | 2 refills | Status: DC

## 2022-06-25 NOTE — Telephone Encounter (Signed)
Med refill request: Paxil-CR Last AEX: 02/15/22 Next AEX: not scheduled Last MMG (if hormonal med) 02/15/22 Breast Density Cat B, BI-RADS CAT 1 neg Refill authorized: Please Advise?

## 2022-07-11 ENCOUNTER — Encounter (INDEPENDENT_AMBULATORY_CARE_PROVIDER_SITE_OTHER): Payer: Self-pay | Admitting: *Deleted

## 2022-09-06 IMAGING — MG MM DIGITAL SCREENING BILAT W/ TOMO AND CAD
6 of 10 series · 6 of 30 positions shown · non-contrast
Comparison: Previous exam(s).

CLINICAL DATA: Screening.

EXAM:
DIGITAL SCREENING BILATERAL MAMMOGRAM WITH TOMOSYNTHESIS AND CAD
TECHNIQUE: Bilateral screening digital craniocaudal and mediolateral oblique
mammograms were obtained. Bilateral screening digital breast
tomosynthesis was performed. The images were evaluated with
computer-aided detection.

[L CC synth-2D (1 of 2)]
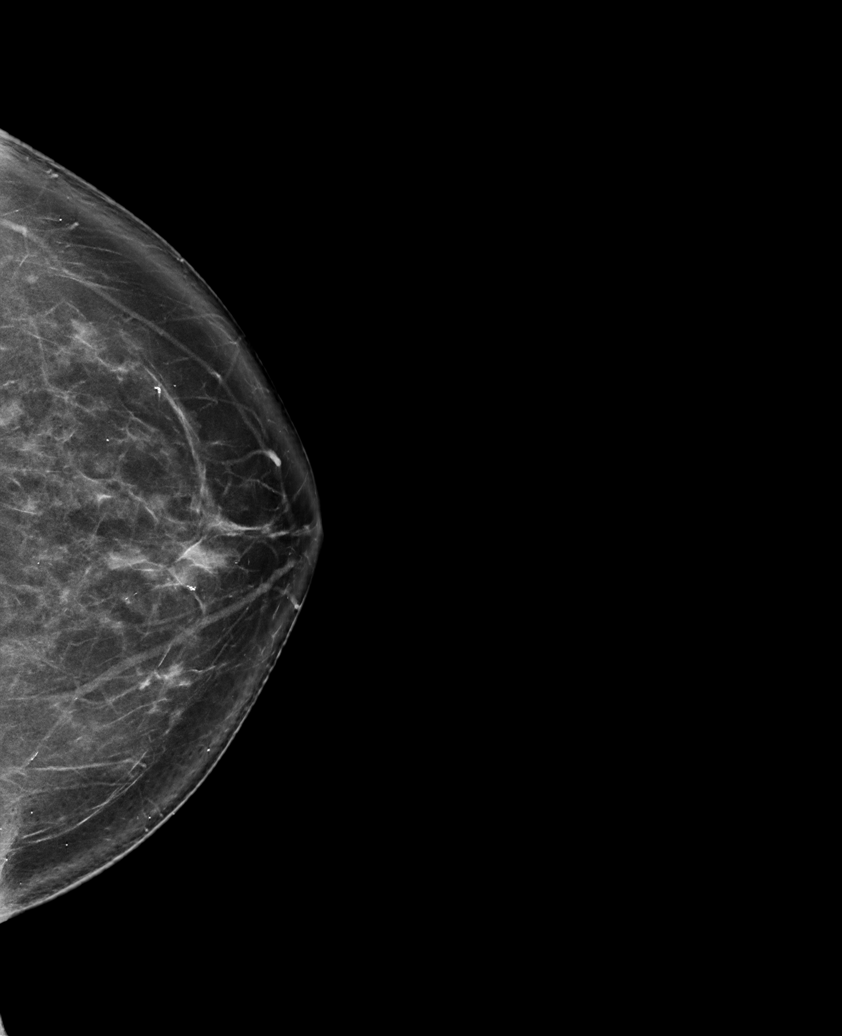

[L MLO synth-2D]
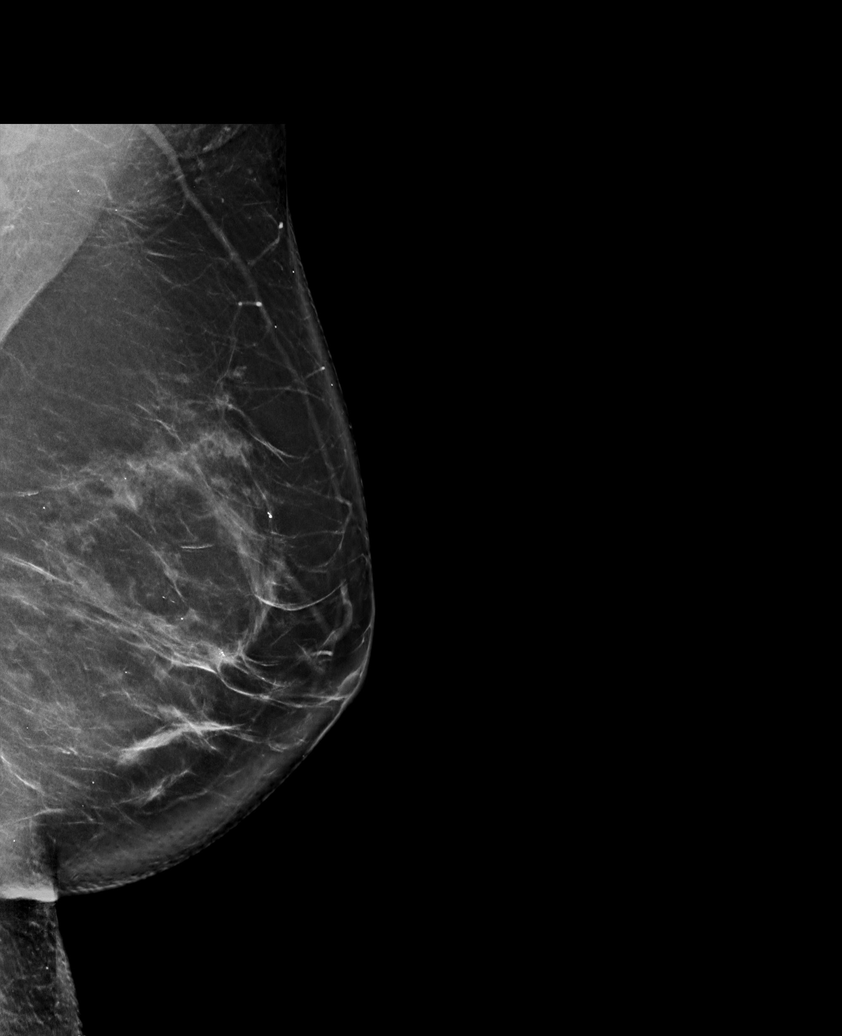

[R CC synth-2D]
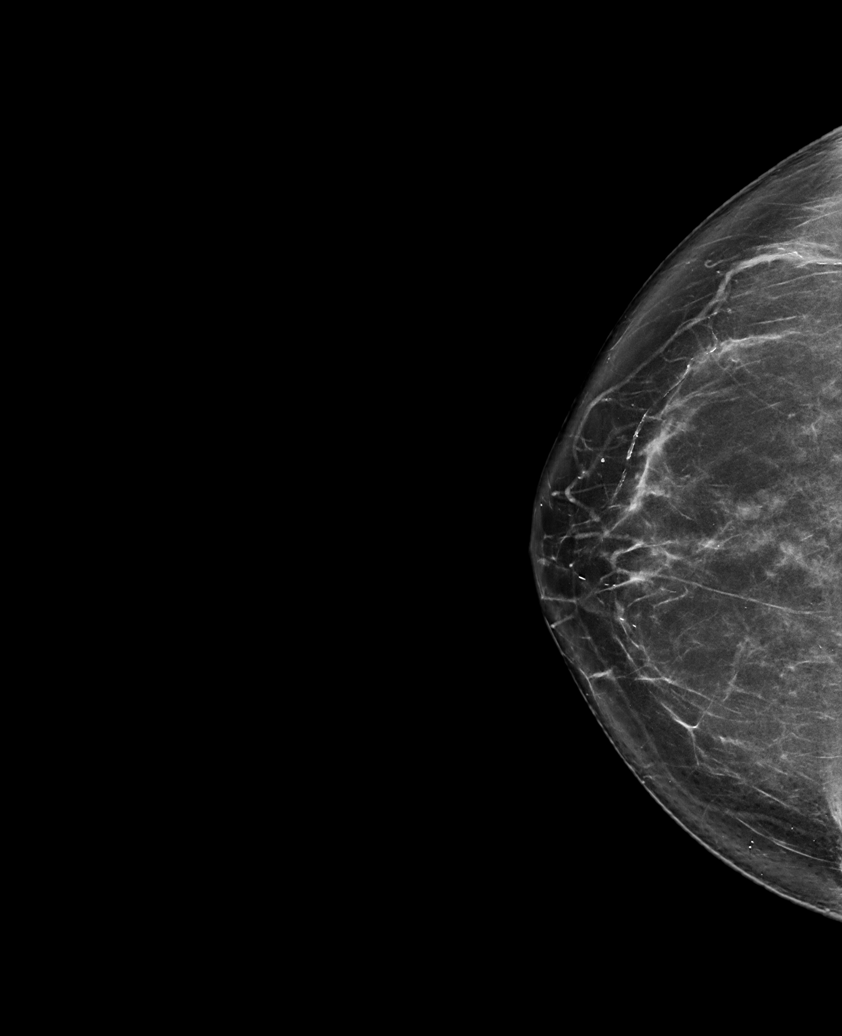

[R MLO synth-2D]
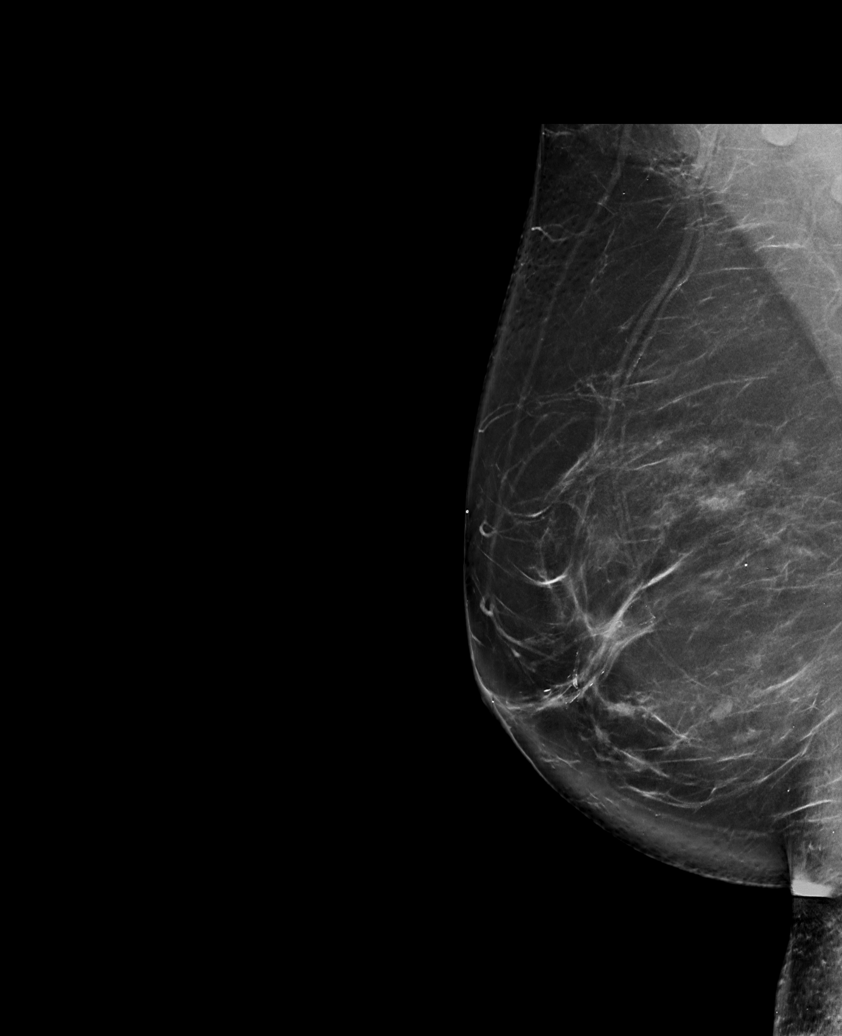

[L CC synth-2D (2 of 2)]
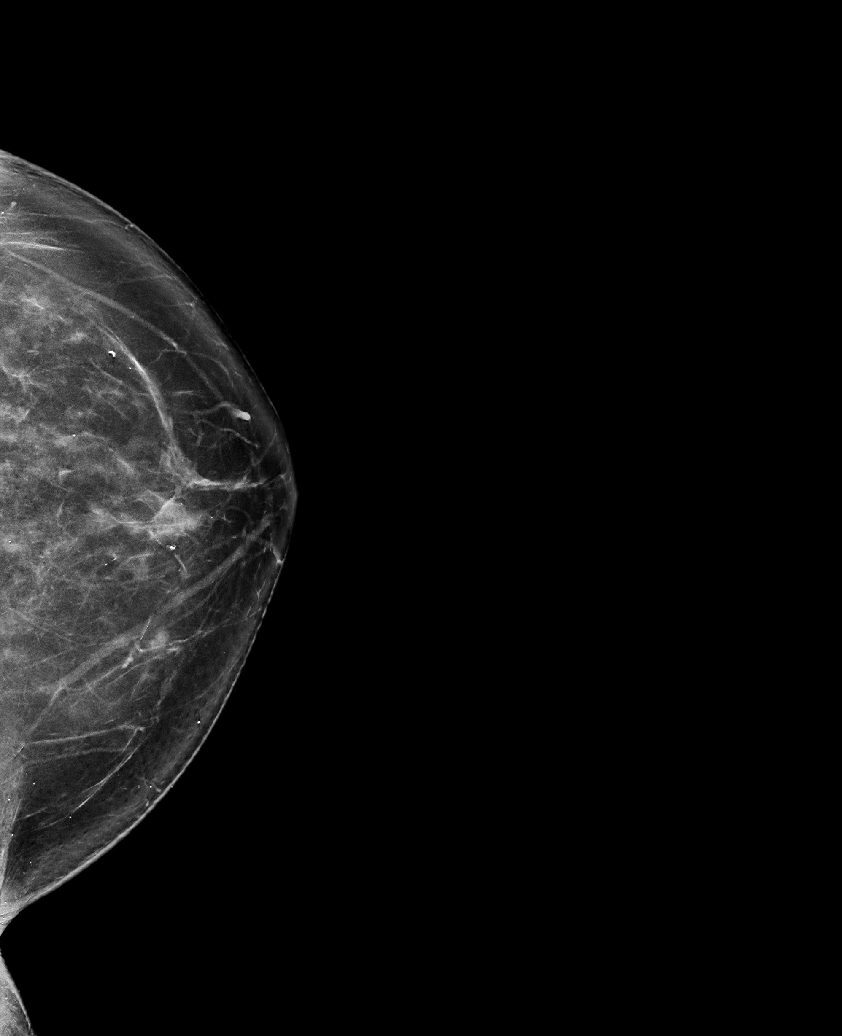

[R MLO tomo · tomo slice 51/101.0]
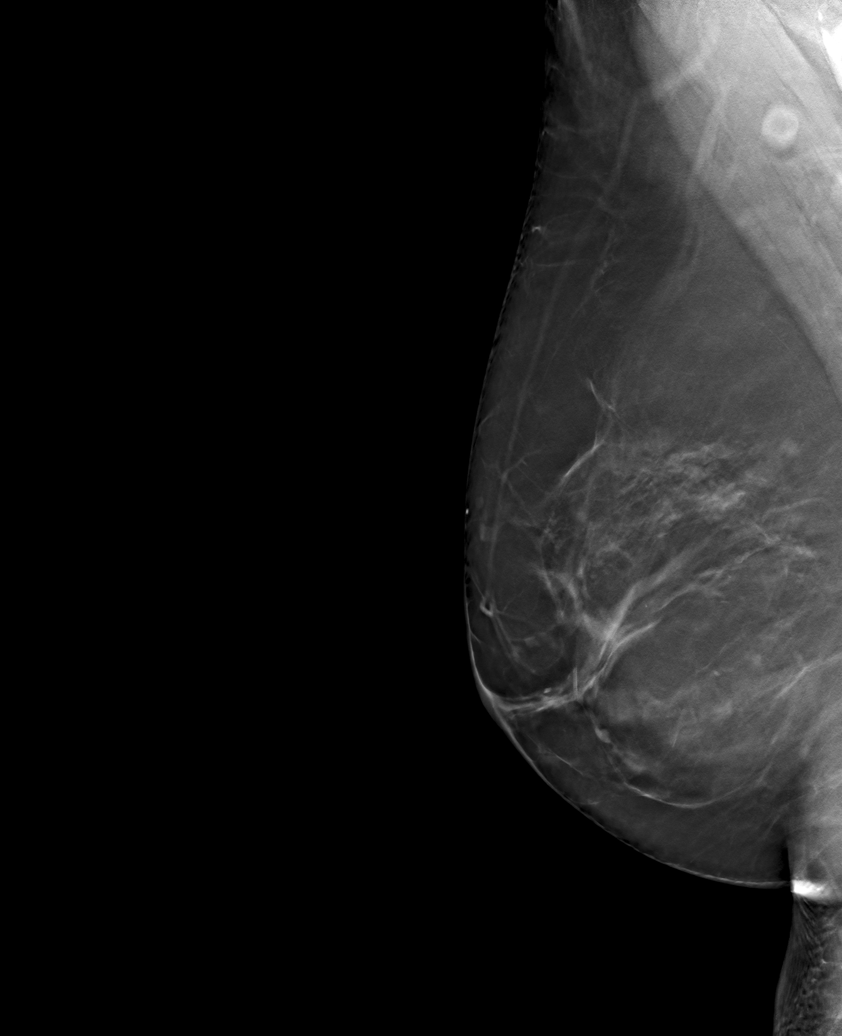

[6 of 30 positions shown; findings below may reference images not displayed]

ACR Breast Density Category c: The breast tissue is heterogeneously
dense, which may obscure small masses.
FINDINGS: There are no findings suspicious for malignancy.
IMPRESSION: No mammographic evidence of malignancy. A result letter of this
screening mammogram will be mailed directly to the patient.

RECOMMENDATION:
Screening mammogram in one year. (Code:[id number])

BI-RADS CATEGORY  1: Negative.

## 2022-09-10 ENCOUNTER — Ambulatory Visit (INDEPENDENT_AMBULATORY_CARE_PROVIDER_SITE_OTHER): Payer: Medicare Other | Admitting: Gastroenterology

## 2022-09-10 ENCOUNTER — Encounter (INDEPENDENT_AMBULATORY_CARE_PROVIDER_SITE_OTHER): Payer: Self-pay | Admitting: Gastroenterology

## 2022-09-10 VITALS — BP 149/77 | HR 71 | Temp 99.0°F | Ht 63.0 in | Wt 159.1 lb

## 2022-09-10 DIAGNOSIS — R933 Abnormal findings on diagnostic imaging of other parts of digestive tract: Secondary | ICD-10-CM

## 2022-09-10 DIAGNOSIS — R1310 Dysphagia, unspecified: Secondary | ICD-10-CM | POA: Diagnosis not present

## 2022-09-10 DIAGNOSIS — K219 Gastro-esophageal reflux disease without esophagitis: Secondary | ICD-10-CM | POA: Insufficient documentation

## 2022-09-10 MED ORDER — OMEPRAZOLE 40 MG PO CPDR: 40.0000 mg | DELAYED_RELEASE_CAPSULE | Freq: Every day | ORAL | 2 refills | Status: DC

## 2022-09-10 NOTE — Patient Instructions (Signed)
Stop protonix 40mg  daily I have sent omeprazole 40mg  daily  to your pharmacy Please take this 30 minutes prior to breakfast Avoid greasy, spicy, fried, citrus foods, and be mindful that caffeine, carbonated drinks, chocolate and alcohol can increase reflux symptoms Stay upright 2-3 hours after eating, prior to lying down and avoid eating late in the evenings.  We will  get you scheduled for upper endoscopy and I will try to obtain the biopsy results from your last colonoscopy   Follow up 3 months  It was a pleasure to see you today. I want to create trusting relationships with patients and provide genuine, compassionate, and quality care. I truly value your feedback! please be on the lookout for a survey regarding your visit with me today. I appreciate your input about our visit and your time in completing this!    Quierra Silverio L. Jeanmarie Hubert, MSN, APRN, AGNP-C Adult-Gerontology Nurse Practitioner [facility] at [facility]

## 2022-09-10 NOTE — Progress Notes (Addendum)
 Referring Provider: Hall, John Z, MD Primary Care Physician:  Hall, John Z, MD Primary GI Physician: new   Chief Complaint  Patient presents with   Gastroesophageal Reflux    Referred for GERD. Has had esophagus stretched years ago. Wakes up in middle of night feeling like throat is closing up. Got choked on water three different times recently.    HPI:   Christina Church is a 66 y.o. female with past medical history of DM, migraines, sleep apnea   Patient presenting today for GERD/dysphagia   Patient reports she has had her throat stretched many years ago, she had done well up until the last few months. She notes that she feels that her throat will close up and she will not be able to breathe well. She notes sometimes she has to put her tongue to the roof of her mouth to swallow. She does at times feel that liquids and foods get stuck in her throat. She notes she has had issues in the middle of the night where she will choke on her own saliva. She notes that issues can happen at any time, with liquids or solids. Reports after previous esophageal dilation she felt symptoms improved. She is on protonix 40mg daily, has been on this for some time, symptoms are not well controlled on this. She was on dexilant in the past which worked well for her.   She note she has some occasional issues with constipation, sometimes diarrhea. No rectal bleeding or melena. She feels that her BMs are pretty normal at this time. She thinks there was mention of IBD after last colonoscopy but she reports she stated she could never really "get a straight answer" about the results and was never started on any therapy. Unsure of exact biopsy results.   Patient denies melena, hematochezia, nausea, vomiting, odyonophagia, early satiety or weight loss.    NSAID use: on occasion  Social hx: no etoh or tobacco  Fam hx: no CRC or liver disease  Last Colonoscopy: ([facility])2021-3 small sessile polyps,  1 in rectum, 1 in distal sigmoid, 1 in mid ascending colon, patchy erythematous mucosa in cecum with erosions, biopsied- Last Endoscopy:many years ago   Recommendations:    Past Medical History:  Diagnosis Date   Diabetes (HCC)    Elevated hemoglobin A1c 07/07/2021   level 6.0.  see scanned report.   Migraines    Post-menopausal bleeding 01/2018    Past Surgical History:  Procedure Laterality Date   CARPAL TUNNEL RELEASE     right and left hand   COLON BIOPSY     DILATATION & CURETTAGE/HYSTEROSCOPY WITH MYOSURE N/A 04/04/2018   Procedure: DILATATION & CURETTAGE/HYSTEROSCOPY WITH MYOSURE;  Surgeon: Fontaine, Timothy P, MD;  Location: [facility];  Service: Gynecology;  Laterality: N/A;  request 7:30am OR time in [facility] requests one hour OR time    Current Outpatient Medications  Medication Sig Dispense Refill   Acetaminophen (TYLENOL PO) Take 500 mg by mouth. As needed     levothyroxine (SYNTHROID) 25 MCG tablet Take 25 mcg by mouth daily.     OVER THE COUNTER MEDICATION Eye promise EZ tears 2 soft gel per day     pantoprazole (PROTONIX) 40 MG tablet Take 40 mg by mouth daily.     PARoxetine (PAXIL-CR) 12.5 MG 24 hr tablet 1 tab(s) orally once a day (in the morning) 90 tablet 2   Povidone (IVIZIA DRY EYES OP) Apply to eye.       progesterone (PROMETRIUM) 100 MG capsule Take 1 capsule (100 mg total) by mouth daily. Take at bedtime.  Stop this medication after 3 months. 90 capsule 0   SUMAtriptan (IMITREX) 50 MG tablet Take 50 mg by mouth every 2 (two) hours as needed for migraine. May repeat in 2 hours if headache persists or recurs.     VITAMIN D PO Take by mouth.     No current facility-administered medications for this visit.    Allergies as of 09/10/2022 - Review Complete 09/10/2022  Allergen Reaction Noted   Codeine-guaifenesin Other (See Comments) 10/13/2010   Meclizine Other (See Comments) 01/03/2018    Family History  Problem Relation Age  of Onset   Diabetes Mother    Osteoporosis Mother    Lung cancer Mother    Stroke Mother    Hypertension Father    Lung cancer Father    Stroke Father    Breast cancer Maternal Aunt 74   Cancer Maternal Aunt        BONE AND LUNG    Social History   Socioeconomic History   Marital status: Married    Spouse name: Not on file   Number of children: Not on file   Years of education: Not on file   Highest education level: Not on file  Occupational History   Not on file  Tobacco Use   Smoking status: Never    Passive exposure: Never   Smokeless tobacco: Never  Vaping Use   Vaping Use: Never used  Substance and Sexual Activity   Alcohol use: No    Alcohol/week: 0.0 standard drinks of alcohol   Drug use: No   Sexual activity: Not Currently    Comment: 1st intercourse 67 yo-Fewer than 5 partners  Other Topics Concern   Not on file  Social History Narrative   Not on file   Social Determinants of Health   Financial Resource Strain: Not on file  Food Insecurity: Not on file  Transportation Needs: Not on file  Physical Activity: Not on file  Stress: Not on file  Social Connections: Not on file    Review of systems General: negative for malaise, night sweats, fever, chills, weight loss Neck: Negative for lumps, goiter, pain and significant neck swelling Resp: Negative for cough, wheezing, dyspnea at rest CV: Negative for chest pain, leg swelling, palpitations, orthopnea GI: denies melena, hematochezia, nausea, vomiting, diarrhea, constipation, dysphagia, odyonophagia, early satiety or unintentional weight loss. +dysphagia  MSK: Negative for joint pain or swelling, back pain, and muscle pain. Derm: Negative for itching or rash Psych: Denies depression, anxiety, memory loss, confusion. No homicidal or suicidal ideation.  Heme: Negative for prolonged bleeding, bruising easily, and swollen nodes. Endocrine: Negative for cold or heat intolerance, polyuria, polydipsia and  goiter. Neuro: negative for tremor, gait imbalance, syncope and seizures. The remainder of the review of systems is noncontributory.  Physical Exam: BP (!) 149/77   Pulse 71   Temp 99 F (37.2 C) (Oral)   Ht 5' 3" (1.6 m)   Wt 159 lb 1.6 oz (72.2 kg)   LMP 11/13/2007   BMI 28.18 kg/m  General:   Alert and oriented. No distress noted. Pleasant and cooperative.  Head:  Normocephalic and atraumatic. Eyes:  Conjuctiva clear without scleral icterus. Mouth:  Oral mucosa pink and moist. Good dentition. No lesions. Heart: Normal rate and rhythm, s1 and s2 heart sounds present.  Lungs: Clear lung sounds in all lobes. Respirations equal and unlabored. Abdomen:  +BS,   soft, non-tender and non-distended. No rebound or guarding. No HSM or masses noted. Derm: No palmar erythema or jaundice Msk:  Symmetrical without gross deformities. Normal posture. Extremities:  Without edema. Neurologic:  Alert and  oriented x4 Psych:  Alert and cooperative. Normal mood and affect.  Invalid input(s): "6 MONTHS"   ASSESSMENT: Christina Church is a 66 y.o. female presenting today as a new patient for GERD/Dysphagia  GERD/Dysphagia: GERD not well managed on protonix, notably on this for the past few years. Also with issues with swallowing liquids/solids, sometimes even saliva will choke her. She will sometimes have to put her tongue to the roof of her mouth to help induce swallowing. Notes history of the same, improved with esophageal dilation in the past. Query if there is some underlying dysmotility, however, given she had improvement in the past with esophageal dilation will proceed with EGD +/- dilation as I cannot rule out esophageal ring, web, stricture, stenosis. If symptoms persist, may need barium esophagram after EGD. Will stop protonix and start omeprazole 40mg daily, should implement good reflux precautions  Abnormal Colonoscopy: Last colonoscopy in 2021, there was presence of a few polyps, as well  as patchy erythematous mucosa in the cecum, concerning for UC, however no biopsy results available for review.  Patient does report she thinks there is mention of IBD during that time however she was never clear about what the true diagnosis was.  She was never started on any therapy.  She denies any family history of IBD or colon cancer.  She has no rectal bleeding, abdominal pain.  Reports some intermittent constipation and occasional diarrhea however she is not have any issues at this time.  I will attempt to obtain these biopsy results for further evaluation as we may need to update a colonoscopy as well.   PLAN:  Stop protonix 40mg daily 2. Start omeprazole 40mg daily, good reflux precautions  3. Schedule EGD +/- dilation, ASA II  4. Obtain biopsy results from Colonoscopy in 2021, consider repeat TCS pending results  All questions were answered, patient verbalized understanding and is in agreement with plan as outlined above.    Follow Up: 3 months    Laina Guerrieri L. Freddye Cardamone, MSN, APRN, AGNP-C Adult-Gerontology Nurse Practitioner [facility]  I have reviewed the note and agree with the APP's assessment as described in this progress note  Daniel Castaneda, MD Gastroenterology and Hepatology [facility] 

## 2022-09-10 NOTE — H&P (View-Only) (Signed)
Referring Provider: Benita Stabile, MD Primary Care Physician:  Benita Stabile, MD Primary GI Physician: new   Chief Complaint  Patient presents with   Gastroesophageal Reflux    Referred for GERD. Has had esophagus stretched years ago. Wakes up in middle of night feeling like throat is closing up. Got choked on water three different times recently.    HPI:   Christina Church is a 67 y.o. female with past medical history of DM, migraines, sleep apnea   Patient presenting today for GERD/dysphagia   Patient reports she has had her throat stretched many years ago, she had done well up until the last few months. She notes that she feels that her throat will close up and she will not be able to breathe well. She notes sometimes she has to put her tongue to the roof of her mouth to swallow. She does at times feel that liquids and foods get stuck in her throat. She notes she has had issues in the middle of the night where she will choke on her own saliva. She notes that issues can happen at any time, with liquids or solids. Reports after previous esophageal dilation she felt symptoms improved. She is on protonix 40mg  daily, has been on this for some time, symptoms are not well controlled on this. She was on dexilant in the past which worked well for her.   She note she has some occasional issues with constipation, sometimes diarrhea. No rectal bleeding or melena. She feels that her BMs are pretty normal at this time. She thinks there was mention of IBD after last colonoscopy but she reports she stated she could never really "get a straight answer" about the results and was never started on any therapy. Unsure of exact biopsy results.   Patient denies melena, hematochezia, nausea, vomiting, odyonophagia, early satiety or weight loss.    NSAID use: on occasion  Social hx: no etoh or tobacco  Fam hx: no CRC or liver disease  Last Colonoscopy: ([facility])2021-3 small sessile polyps,  1 in rectum, 1 in distal sigmoid, 1 in mid ascending colon, patchy erythematous mucosa in cecum with erosions, biopsied- Last Endoscopy:many years ago   Recommendations:    Past Medical History:  Diagnosis Date   Diabetes (HCC)    Elevated hemoglobin A1c 07/07/2021   level 6.0.  see scanned report.   Migraines    Post-menopausal bleeding 01/2018    Past Surgical History:  Procedure Laterality Date   CARPAL TUNNEL RELEASE     right and left hand   COLON BIOPSY     DILATATION & CURETTAGE/HYSTEROSCOPY WITH MYOSURE N/A 04/04/2018   Procedure: DILATATION & CURETTAGE/HYSTEROSCOPY WITH MYOSURE;  Surgeon: Dara Lords, MD;  Location: [facility];  Service: Gynecology;  Laterality: N/A;  request 7:30am OR time in [facility] Block requests one hour OR time    Current Outpatient Medications  Medication Sig Dispense Refill   Acetaminophen (TYLENOL PO) Take 500 mg by mouth. As needed     levothyroxine (SYNTHROID) 25 MCG tablet Take 25 mcg by mouth daily.     OVER THE COUNTER MEDICATION Eye promise EZ tears 2 soft gel per day     pantoprazole (PROTONIX) 40 MG tablet Take 40 mg by mouth daily.     PARoxetine (PAXIL-CR) 12.5 MG 24 hr tablet 1 tab(s) orally once a day (in the morning) 90 tablet 2   Povidone (IVIZIA DRY EYES OP) Apply to eye.  progesterone (PROMETRIUM) 100 MG capsule Take 1 capsule (100 mg total) by mouth daily. Take at bedtime.  Stop this medication after 3 months. 90 capsule 0   SUMAtriptan (IMITREX) 50 MG tablet Take 50 mg by mouth every 2 (two) hours as needed for migraine. May repeat in 2 hours if headache persists or recurs.     VITAMIN D PO Take by mouth.     No current facility-administered medications for this visit.    Allergies as of 09/10/2022 - Review Complete 09/10/2022  Allergen Reaction Noted   Codeine-guaifenesin Other (See Comments) 10/13/2010   Meclizine Other (See Comments) 01/03/2018    Family History  Problem Relation Age  of Onset   Diabetes Mother    Osteoporosis Mother    Lung cancer Mother    Stroke Mother    Hypertension Father    Lung cancer Father    Stroke Father    Breast cancer Maternal Aunt 42   Cancer Maternal Aunt        BONE AND LUNG    Social History   Socioeconomic History   Marital status: Married    Spouse name: Not on file   Number of children: Not on file   Years of education: Not on file   Highest education level: Not on file  Occupational History   Not on file  Tobacco Use   Smoking status: Never    Passive exposure: Never   Smokeless tobacco: Never  Vaping Use   Vaping Use: Never used  Substance and Sexual Activity   Alcohol use: No    Alcohol/week: 0.0 standard drinks of alcohol   Drug use: No   Sexual activity: Not Currently    Comment: 1st intercourse 67 yo-Fewer than 5 partners  Other Topics Concern   Not on file  Social History Narrative   Not on file   Social Determinants of Health   Financial Resource Strain: Not on file  Food Insecurity: Not on file  Transportation Needs: Not on file  Physical Activity: Not on file  Stress: Not on file  Social Connections: Not on file    Review of systems General: negative for malaise, night sweats, fever, chills, weight loss Neck: Negative for lumps, goiter, pain and significant neck swelling Resp: Negative for cough, wheezing, dyspnea at rest CV: Negative for chest pain, leg swelling, palpitations, orthopnea GI: denies melena, hematochezia, nausea, vomiting, diarrhea, constipation, dysphagia, odyonophagia, early satiety or unintentional weight loss. +dysphagia  MSK: Negative for joint pain or swelling, back pain, and muscle pain. Derm: Negative for itching or rash Psych: Denies depression, anxiety, memory loss, confusion. No homicidal or suicidal ideation.  Heme: Negative for prolonged bleeding, bruising easily, and swollen nodes. Endocrine: Negative for cold or heat intolerance, polyuria, polydipsia and  goiter. Neuro: negative for tremor, gait imbalance, syncope and seizures. The remainder of the review of systems is noncontributory.  Physical Exam: BP (!) 149/77   Pulse 71   Temp 99 F (37.2 C) (Oral)   Ht 5\' 3"  (1.6 m)   Wt 159 lb 1.6 oz (72.2 kg)   LMP 11/13/2007   BMI 28.18 kg/m  General:   Alert and oriented. No distress noted. Pleasant and cooperative.  Head:  Normocephalic and atraumatic. Eyes:  Conjuctiva clear without scleral icterus. Mouth:  Oral mucosa pink and moist. Good dentition. No lesions. Heart: Normal rate and rhythm, s1 and s2 heart sounds present.  Lungs: Clear lung sounds in all lobes. Respirations equal and unlabored. Abdomen:  +BS,  soft, non-tender and non-distended. No rebound or guarding. No HSM or masses noted. Derm: No palmar erythema or jaundice Msk:  Symmetrical without gross deformities. Normal posture. Extremities:  Without edema. Neurologic:  Alert and  oriented x4 Psych:  Alert and cooperative. Normal mood and affect.  Invalid input(s): "6 MONTHS"   ASSESSMENT: Christina Church is a 67 y.o. female presenting today as a new patient for GERD/Dysphagia  GERD/Dysphagia: GERD not well managed on protonix, notably on this for the past few years. Also with issues with swallowing liquids/solids, sometimes even saliva will choke her. She will sometimes have to put her tongue to the roof of her mouth to help induce swallowing. Notes history of the same, improved with esophageal dilation in the past. Query if there is some underlying dysmotility, however, given she had improvement in the past with esophageal dilation will proceed with EGD +/- dilation as I cannot rule out esophageal ring, web, stricture, stenosis. If symptoms persist, may need barium esophagram after EGD. Will stop protonix and start omeprazole 40mg  daily, should implement good reflux precautions  Abnormal Colonoscopy: Last colonoscopy in 2021, there was presence of a few polyps, as well  as patchy erythematous mucosa in the cecum, concerning for UC, however no biopsy results available for review.  Patient does report she thinks there is mention of IBD during that time however she was never clear about what the true diagnosis was.  She was never started on any therapy.  She denies any family history of IBD or colon cancer.  She has no rectal bleeding, abdominal pain.  Reports some intermittent constipation and occasional diarrhea however she is not have any issues at this time.  I will attempt to obtain these biopsy results for further evaluation as we may need to update a colonoscopy as well.   PLAN:  Stop protonix 40mg  daily 2. Start omeprazole 40mg  daily, good reflux precautions  3. Schedule EGD +/- dilation, ASA II  4. Obtain biopsy results from Colonoscopy in 2021, consider repeat TCS pending results  All questions were answered, patient verbalized understanding and is in agreement with plan as outlined above.    Follow Up: 3 months    Christina Cheek L. Jeanmarie Hubert, MSN, APRN, AGNP-C Adult-Gerontology Nurse Practitioner [facility]  I have reviewed the note and agree with the APP's assessment as described in this progress note  Katrinka Blazing, MD Gastroenterology and Hepatology [facility]

## 2022-09-14 ENCOUNTER — Ambulatory Visit (HOSPITAL_COMMUNITY): Payer: Medicare Other | Admitting: Certified Registered Nurse Anesthetist

## 2022-09-14 ENCOUNTER — Ambulatory Visit (HOSPITAL_COMMUNITY)
Admission: RE | Admit: 2022-09-14 | Discharge: 2022-09-14 | Disposition: A | Discharge status: Home / Self Care | Payer: Medicare Other | Attending: Gastroenterology | Admitting: Gastroenterology

## 2022-09-14 ENCOUNTER — Encounter (HOSPITAL_COMMUNITY): Payer: Self-pay | Admitting: Gastroenterology

## 2022-09-14 ENCOUNTER — Encounter (HOSPITAL_COMMUNITY)
Admission: RE | Disposition: A | Discharge status: Home / Self Care | Payer: Self-pay | Source: Home / Self Care | Attending: Gastroenterology

## 2022-09-14 ENCOUNTER — Other Ambulatory Visit: Payer: Self-pay

## 2022-09-14 DIAGNOSIS — K219 Gastro-esophageal reflux disease without esophagitis: Secondary | ICD-10-CM | POA: Diagnosis present

## 2022-09-14 DIAGNOSIS — R1310 Dysphagia, unspecified: Secondary | ICD-10-CM | POA: Diagnosis not present

## 2022-09-14 DIAGNOSIS — K2289 Other specified disease of esophagus: Secondary | ICD-10-CM | POA: Insufficient documentation

## 2022-09-14 DIAGNOSIS — K449 Diaphragmatic hernia without obstruction or gangrene: Secondary | ICD-10-CM | POA: Diagnosis not present

## 2022-09-14 DIAGNOSIS — Z79899 Other long term (current) drug therapy: Secondary | ICD-10-CM | POA: Insufficient documentation

## 2022-09-14 DIAGNOSIS — G4730 Sleep apnea, unspecified: Secondary | ICD-10-CM | POA: Insufficient documentation

## 2022-09-14 DIAGNOSIS — K317 Polyp of stomach and duodenum: Secondary | ICD-10-CM

## 2022-09-14 DIAGNOSIS — E119 Type 2 diabetes mellitus without complications: Secondary | ICD-10-CM | POA: Diagnosis not present

## 2022-09-14 HISTORY — PX: BIOPSY: SHX5522

## 2022-09-14 HISTORY — PX: ESOPHAGEAL DILATION: SHX303

## 2022-09-14 HISTORY — PX: ESOPHAGOGASTRODUODENOSCOPY (EGD) WITH PROPOFOL: SHX5813

## 2022-09-14 LAB — GLUCOSE, CAPILLARY: Glucose-Capillary: 91 mg/dL (ref 70–99)

## 2022-09-14 SURGERY — ESOPHAGOGASTRODUODENOSCOPY (EGD) WITH PROPOFOL
Anesthesia: General

## 2022-09-14 MED ORDER — PHENYLEPHRINE HCL (PRESSORS) 10 MG/ML IV SOLN
INTRAVENOUS | Status: DC | PRN
  Administered 2022-09-14: 80 ug via INTRAVENOUS

## 2022-09-14 MED ORDER — LIDOCAINE HCL (PF) 2 % IJ SOLN
INTRAMUSCULAR | Status: AC
  Filled 2022-09-14: qty 5

## 2022-09-14 MED ORDER — PROPOFOL 500 MG/50ML IV EMUL
INTRAVENOUS | Status: DC | PRN
  Administered 2022-09-14: 180 ug/kg/min via INTRAVENOUS

## 2022-09-14 MED ORDER — PROPOFOL 10 MG/ML IV BOLUS
INTRAVENOUS | Status: DC | PRN
  Administered 2022-09-14: 80 mg via INTRAVENOUS

## 2022-09-14 MED ORDER — LACTATED RINGERS IV SOLN
INTRAVENOUS | Status: DC
  Administered 2022-09-14: 1000 mL via INTRAVENOUS

## 2022-09-14 MED ORDER — LIDOCAINE HCL (CARDIAC) PF 100 MG/5ML IV SOSY
PREFILLED_SYRINGE | INTRAVENOUS | Status: DC | PRN
  Administered 2022-09-14: 50 mg via INTRAVENOUS

## 2022-09-14 NOTE — Op Note (Signed)
[facility] Patient Name: Christina Church Procedure Date: 09/14/2022 1:16 PM MRN: [id number] Date of Birth: 09-23-1955 Attending MD: Katrinka Blazing , , [id number] CSN: [id number] Age: 67 Admit Type: Outpatient Procedure:                Upper GI endoscopy Indications:              Dysphagia Providers:                Katrinka Blazing, Buel Ream. Thomasena Edis RN, RN,                            Zena Amos Referring MD:              Medicines:                Monitored Anesthesia Care Complications:            No immediate complications. Estimated Blood Loss:     Estimated blood loss: none. Procedure:                Pre-Anesthesia Assessment:                           - Prior to the procedure, a History and Physical                            was performed, and patient medications, allergies                            and sensitivities were reviewed. The patient's                            tolerance of previous anesthesia was reviewed.                           - The risks and benefits of the procedure and the                            sedation options and risks were discussed with the                            patient. All questions were answered and informed                            consent was obtained.                           - ASA Grade Assessment: II - A patient with mild                            systemic disease.                           After obtaining informed consent, the endoscope was                            passed under direct vision. Throughout the  procedure, the patient's blood pressure, pulse, and                            oxygen saturations were monitored continuously. The                            [id number]-H190 ([id number]) scope was introduced through the                            mouth, and advanced to the second part of duodenum.                            The upper GI endoscopy was accomplished without                             difficulty. The patient tolerated the procedure                            well. Scope In: 1:28:55 PM Scope Out: 1:43:56 PM Total Procedure Duration: 0 hours 15 minutes 1 second  Findings:      Two tongues of salmon-colored mucosa were present from 36 to 37 cm. No       other visible abnormalities were present. The maximum longitudinal       extent of these esophageal mucosal changes was 1 cm in length. Biopsies       were taken with a cold forceps for histology.      No endoscopic abnormality was evident in the esophagus to explain the       patient's complaint of dysphagia. It was decided, however, to proceed       with dilation of the entire esophagus. A guidewire was placed and the       scope was withdrawn. Dilation was performed with a Savary dilator with       no resistance at 18 mm. The dilation site was examined following       endoscope reinsertion and showed no change.      A 2 cm hiatal hernia was present.      Three 4 to 5 mm sessile polyps with no bleeding were found in the       cardia, just distal to the GE junction -- had a mild inflammatory       appearance. Biopsies were taken with a cold forceps for histology.      The exam of the stomach was otherwise normal.      The examined duodenum was normal. Impression:               - Salmon-colored mucosa suspicious for                            short-segment Barrett's esophagus. Biopsied.                           - No endoscopic esophageal abnormality to explain                            patient's dysphagia. Esophagus dilated. Dilated.                           -  2 cm hiatal hernia.                           - Three gastric polyps. Biopsied.                           - Normal examined duodenum. Moderate Sedation:      Per Anesthesia Care Recommendation:           - Discharge patient to home (ambulatory).                           - Resume previous diet.                           - Await pathology results.                            - Continue present medications.                           - Schedule modified barium swallow and S/S                            evaluation. Procedure Code(s):        --- Professional ---                           [phone], Esophagogastroduodenoscopy, flexible,                            transoral; with insertion of guide wire followed by                            passage of dilator(s) through esophagus over guide                            wire                           43239, 59, Esophagogastroduodenoscopy, flexible,                            transoral; with biopsy, single or multiple Diagnosis Code(s):        --- Professional ---                           K22.89, Other specified disease of esophagus                           R13.10, Dysphagia, unspecified                           K44.9, Diaphragmatic hernia without obstruction or                            gangrene  K31.7, Polyp of stomach and duodenum CPT copyright 2022 American [employer] Association. All rights reserved. The codes documented in this report are preliminary and upon coder review may  be revised to meet current compliance requirements. Katrinka Blazing, MD Katrinka Blazing,  09/14/2022 1:52:16 PM This report has been signed electronically. Number of Addenda: 0

## 2022-09-14 NOTE — Discharge Instructions (Signed)
You are being discharged to home.  Resume your previous diet.  We are waiting for your pathology results.  Continue your present medications.  Schedule modified barium swallow and S/S evaluation.

## 2022-09-14 NOTE — Transfer of Care (Signed)
Immediate Anesthesia Transfer of Care Note  Patient: Christina Church  Procedure(s) Performed: ESOPHAGOGASTRODUODENOSCOPY (EGD) WITH PROPOFOL ESOPHAGEAL DILATION BIOPSY  Patient Location: Endoscopy Unit  Anesthesia Type:General  Level of Consciousness: awake, alert , and oriented  Airway & Oxygen Therapy: Patient Spontanous Breathing  Post-op Assessment: Report given to RN, Post -op Vital signs reviewed and stable, Patient moving all extremities X 4, and Patient able to stick tongue midline  Post vital signs: Reviewed  Last Vitals:  Vitals Value Taken Time  BP 116/54 09/14/22 1349  Temp 36.5 C 09/14/22 1349  Pulse 78 09/14/22 1349  Resp 24 09/14/22 1349  SpO2 98 % 09/14/22 1349    Last Pain:  Vitals:   09/14/22 1349  TempSrc: Oral  PainSc: 0-No pain      Patients Stated Pain Goal: 8 (09/14/22 1148)  Complications: No notable events documented.

## 2022-09-14 NOTE — Anesthesia Preprocedure Evaluation (Signed)
Anesthesia Evaluation  Patient identified by MRN, date of birth, ID band Patient awake    Reviewed: Allergy & Precautions, H&P , NPO status , Patient's Chart, lab work & pertinent test results, reviewed documented beta blocker date and time   Airway Mallampati: II  TM Distance: >3 FB Neck ROM: full    Dental no notable dental hx.    Pulmonary neg pulmonary ROS   Pulmonary exam normal breath sounds clear to auscultation       Cardiovascular Exercise Tolerance: Good negative cardio ROS  Rhythm:regular Rate:Normal     Neuro/Psych  Headaches PSYCHIATRIC DISORDERS      negative neurological ROS  negative psych ROS   GI/Hepatic negative GI ROS, Neg liver ROS,GERD  ,,  Endo/Other  negative endocrine ROSdiabetes    Renal/GU negative Renal ROS  negative genitourinary   Musculoskeletal   Abdominal   Peds  Hematology negative hematology ROS (+)   Anesthesia Other Findings   Reproductive/Obstetrics negative OB ROS                             Anesthesia Physical Anesthesia Plan  ASA: 2  Anesthesia Plan: General   Post-op Pain Management:    Induction:   PONV Risk Score and Plan: Propofol infusion  Airway Management Planned:   Additional Equipment:   Intra-op Plan:   Post-operative Plan:   Informed Consent: I have reviewed the patients History and Physical, chart, labs and discussed the procedure including the risks, benefits and alternatives for the proposed anesthesia with the patient or authorized representative who has indicated his/her understanding and acceptance.     Dental Advisory Given  Plan Discussed with: CRNA  Anesthesia Plan Comments:        Anesthesia Quick Evaluation

## 2022-09-14 NOTE — Interval H&P Note (Signed)
History and Physical Interval Note:  09/14/2022 11:01 AM  Christina Church  has presented today for surgery, with the diagnosis of DYSPHAGIA.  The various methods of treatment have been discussed with the patient and family. After consideration of risks, benefits and other options for treatment, the patient has consented to  Procedure(s) with comments: ESOPHAGOGASTRODUODENOSCOPY (EGD) WITH PROPOFOL (N/A) - 1:00PM;ASA 2 ESOPHAGEAL DILATION (N/A) - 1:00PM;ASA 2 as a surgical intervention.  The patient's history has been reviewed, patient examined, no change in status, stable for surgery.  I have reviewed the patient's chart and labs.  Questions were answered to the patient's satisfaction.     [name]

## 2022-09-17 ENCOUNTER — Telehealth (INDEPENDENT_AMBULATORY_CARE_PROVIDER_SITE_OTHER): Payer: Self-pay | Admitting: *Deleted

## 2022-09-17 ENCOUNTER — Other Ambulatory Visit (INDEPENDENT_AMBULATORY_CARE_PROVIDER_SITE_OTHER): Payer: Self-pay | Admitting: Gastroenterology

## 2022-09-17 DIAGNOSIS — T17308D Unspecified foreign body in larynx causing other injury, subsequent encounter: Secondary | ICD-10-CM

## 2022-09-17 DIAGNOSIS — R1312 Dysphagia, oropharyngeal phase: Secondary | ICD-10-CM

## 2022-09-17 LAB — SURGICAL PATHOLOGY

## 2022-09-17 NOTE — Telephone Encounter (Addendum)
Reading op note from 09/14/22 - Does patient need referral for speech therapy & MBSS  If so will you place order for DG: Swallowing func-speech pathology

## 2022-09-17 NOTE — Anesthesia Postprocedure Evaluation (Signed)
Anesthesia Post Note  Patient: Harlee Abernathey [place]  Procedure(s) Performed: ESOPHAGOGASTRODUODENOSCOPY (EGD) WITH PROPOFOL ESOPHAGEAL DILATION BIOPSY  Patient location during evaluation: Phase II Anesthesia Type: General Level of consciousness: awake Pain management: pain level controlled Vital Signs Assessment: post-procedure vital signs reviewed and stable Respiratory status: spontaneous breathing and respiratory function stable Cardiovascular status: blood pressure returned to baseline and stable Postop Assessment: no headache and no apparent nausea or vomiting Anesthetic complications: no Comments: Late entry   No notable events documented.   Last Vitals:  Vitals:   09/14/22 1148 09/14/22 1349  BP: (!) 143/60 (!) 116/54  Pulse: (!) 54 78  Resp: 18 (!) 24  Temp: 36.6 C 36.5 C  SpO2: 98% 98%    Last Pain:  Vitals:   09/14/22 1349  TempSrc: Oral  PainSc: 0-No pain                 Windell Norfolk

## 2022-09-17 NOTE — Telephone Encounter (Signed)
Thank you. Referral has been placed in epic

## 2022-09-17 NOTE — Telephone Encounter (Signed)
Orders in 

## 2022-09-17 NOTE — Telephone Encounter (Signed)
Thanks

## 2022-09-18 ENCOUNTER — Other Ambulatory Visit (HOSPITAL_COMMUNITY): Payer: Self-pay | Admitting: Occupational Therapy

## 2022-09-18 DIAGNOSIS — R1312 Dysphagia, oropharyngeal phase: Secondary | ICD-10-CM

## 2022-09-18 DIAGNOSIS — K219 Gastro-esophageal reflux disease without esophagitis: Secondary | ICD-10-CM

## 2022-09-19 ENCOUNTER — Encounter (INDEPENDENT_AMBULATORY_CARE_PROVIDER_SITE_OTHER): Payer: Self-pay

## 2022-09-24 ENCOUNTER — Encounter (HOSPITAL_COMMUNITY): Payer: Self-pay | Admitting: Gastroenterology

## 2022-09-25 ENCOUNTER — Telehealth (INDEPENDENT_AMBULATORY_CARE_PROVIDER_SITE_OTHER): Payer: Self-pay | Admitting: Gastroenterology

## 2022-10-08 ENCOUNTER — Ambulatory Visit (HOSPITAL_COMMUNITY): Payer: Medicare Other | Attending: Gastroenterology | Admitting: Speech Pathology

## 2022-10-08 ENCOUNTER — Encounter (HOSPITAL_COMMUNITY): Payer: Self-pay | Admitting: Speech Pathology

## 2022-10-08 ENCOUNTER — Ambulatory Visit (HOSPITAL_COMMUNITY)
Admission: RE | Admit: 2022-10-08 | Discharge: 2022-10-08 | Disposition: A | Discharge status: Home / Self Care | Payer: Medicare Other | Source: Ambulatory Visit | Attending: Gastroenterology | Admitting: Gastroenterology

## 2022-10-08 DIAGNOSIS — K219 Gastro-esophageal reflux disease without esophagitis: Secondary | ICD-10-CM | POA: Diagnosis present

## 2022-10-08 DIAGNOSIS — R1312 Dysphagia, oropharyngeal phase: Secondary | ICD-10-CM | POA: Diagnosis not present

## 2022-10-08 NOTE — Therapy (Signed)
[facility] at [facility] [address] [place], [place], [address] Phone: [phone]   Fax:  [phone]  Modified Barium Swallow  Patient Details  Name: Christina Church MRN: [id number] Date of Birth: [birth year] No data recorded  Encounter Date: 10/08/2022   End of Session - 10/08/22 1231     Visit Number 1    Number of Visits 1    SLP Start Time 1331    SLP Stop Time  1201    SLP Time Calculation (min) 1350 min    Activity Tolerance Patient tolerated treatment well              HPI/PMH: HPI: Christina Church is a 67 y.o. female with past medical history of DM, migraines, sleep apnea. Pt recently underwent EGD with esophageal dilation 09/14/2022 after reports of dysphagia and food getting stuck. Pt reports immediate relief from symptoms after dilation, however she reports new symptoms of coughing and sore throat in the past couple of weeks. She has been tested for COVID and Strep which were both negative. MBSS requested to objectivley assess the swallowing function.   Clinical Impression: Pt's oropharyngeal swallowing presents to be within functional limits today on MBSS. Oral coordination of swallowing and timing of swallow is WFL, good laryngeal vestibule closure, good pharyngeal stripping wave and laryngeal excursion. Note occasional trace pharyngeal residue cleared with a reflexive repeat swallow. Brief stasis of the barium tablet noted in the proximal esophagus that was cleared with one additional puree bolus presentation, radiologist present to confirm. SLP reviewed universal aspiration precautions with the Pt. Recommend continue with regular diet and thin liquids. There are no further ST needs noted at this time, Thank you for this referral.  Factors that may increase risk of adverse event in presence of aspiration Christina Church & Christina Church 2021): No data recorded  Recommendations/Plan: Swallowing Evaluation Recommendations Swallowing  Evaluation Recommendations Recommendations: PO diet PO Diet Recommendation: Regular; Thin liquids (Level 0) Liquid Administration via: Spoon; Cup; Straw Medication Administration: Whole meds with liquid Supervision: Patient able to self-feed Oral care recommendations: Oral care BID (2x/day)    Treatment Plan Treatment Plan Treatment recommendations: No treatment recommended at this time Follow-up recommendations: No SLP follow up     Recommendations Recommendations for follow up therapy are one component of a multi-disciplinary discharge planning process, led by the attending physician.  Recommendations may be updated based on patient status, additional functional criteria and insurance authorization.  Assessment: Orofacial Exam: Orofacial Exam Oral Cavity: Oral Hygiene: WFL Oral Cavity - Dentition: Adequate natural dentition Orofacial Anatomy: WFL Oral Motor/Sensory Function: WFL    Anatomy:  Anatomy: WFL   Boluses Administered: Boluses Administered Boluses Administered: Thin liquids (Level 0); Mildly thick liquids (Level 2, nectar thick); Moderately thick liquids (Level 3, honey thick); Puree; Solid     Oral Impairment Domain: Oral Impairment Domain Lip Closure: No labial escape Tongue control during bolus hold: Not tested Bolus preparation/mastication: Timely and efficient chewing and mashing Bolus transport/lingual motion: Brisk tongue motion Oral residue: Complete oral Christina Location of oral residue : N/A Initiation of pharyngeal swallow : Posterior angle of the ramus     Pharyngeal Impairment Domain: Pharyngeal Impairment Domain Soft palate elevation: No bolus between soft palate (SP)/pharyngeal wall (PW) Laryngeal elevation: Complete superior movement of thyroid cartilage with complete approximation of arytenoids to epiglottic petiole Anterior hyoid excursion: Complete anterior movement Epiglottic movement: Complete inversion Laryngeal vestibule  closure: Complete, no air/contrast in laryngeal vestibule Pharyngeal stripping wave : Present -  complete Pharyngeal contraction (A/P view only): N/A Pharyngoesophageal segment opening: Complete distension and complete duration, no obstruction of flow Tongue base retraction: No contrast between tongue base and posterior pharyngeal wall (PPW) Pharyngeal residue: Trace residue within or on pharyngeal structures Location of pharyngeal residue: Tongue base     Esophageal Impairment Domain: Esophageal Impairment Domain Esophageal Christina upright position: Esophageal retention    Pill: Pill Consistency administered: Thin liquids (Level 0)    Penetration/Aspiration Scale Score: Penetration/Aspiration Scale Score 1.  Material does not enter airway: Thin liquids (Level 0)    Compensatory Strategies: Compensatory Strategies Compensatory strategies: No       General Information: Caregiver present: No   Diet Prior to this Study: Regular; Thin liquids (Level 0)    Temperature : Normal    Respiratory Status: WFL    Supplemental O2: None (Room air)    History of Recent Intubation: No   Behavior/Cognition: Alert; Cooperative; Confused  Self-Feeding Abilities: Able to self-feed  Baseline vocal quality/speech: Normal  Volitional Cough: Able to elicit  Volitional Swallow: Able to elicit  No data recorded  Goal Planning: No data recorded No data recorded No data recorded No data recorded No data recorded  Pain: No data recorded  End of Session: Start Time:No data recorded Stop Time: No data recorded Time Calculation:No data recorded Charges: No data recorded SLP visit diagnosis: SLP Visit Diagnosis: Dysphagia, unspecified (R13.10)    Past Medical History:  Past Medical History:  Diagnosis Date   Diabetes (HCC)    Elevated hemoglobin A1c 07/07/2021   level 6.0.  see scanned report.   Migraines    Post-menopausal bleeding 01/2018   Past Surgical  History:  Past Surgical History:  Procedure Laterality Date   BIOPSY  09/14/2022   Procedure: BIOPSY;  Surgeon: Dolores Frame, MD;  [facility]: [facility];  Service: Gastroenterology;;   CARPAL TUNNEL RELEASE     right and left hand   cataract surgery Bilateral    COLON BIOPSY     DILATATION & CURETTAGE/HYSTEROSCOPY WITH MYOSURE N/A 04/04/2018   Procedure: DILATATION & CURETTAGE/HYSTEROSCOPY WITH MYOSURE;  Surgeon: Dara Lords, MD;  Location: [facility];  Service: Gynecology;  Laterality: N/A;  request 7:30am OR time in [place] Block requests one hour OR time   ESOPHAGEAL DILATION N/A 09/14/2022   Procedure: ESOPHAGEAL DILATION;  Surgeon: Dolores Frame, MD;  Location: [facility];  Service: Gastroenterology;  Laterality: N/A;  1:00PM;ASA 2   ESOPHAGOGASTRODUODENOSCOPY (EGD) WITH PROPOFOL N/A 09/14/2022   Procedure: ESOPHAGOGASTRODUODENOSCOPY (EGD) WITH PROPOFOL;  Surgeon: Dolores Frame, MD;  [facility]: [facility];  Service: Gastroenterology;  Laterality: N/A;  1:00PM;ASA 2   TONSILLECTOMY     age 53   Agustus Mane H. Romie Levee, CCC-SLP Speech Language Pathologist   Georgetta Haber 10/08/2022, 12:32 PM Georgetta Haber, CCC-SLP 10/08/2022, 12:32 PM  [facility] at [facility] [address] [place], [place], [address] Phone: [phone]   Fax:  [phone]  Name: Christina Church MRN: [id number] Date of Birth: Apr 03, 1955

## 2022-10-25 ENCOUNTER — Other Ambulatory Visit: Payer: Self-pay | Admitting: Obstetrics and Gynecology

## 2022-10-25 DIAGNOSIS — Z1231 Encounter for screening mammogram for malignant neoplasm of breast: Secondary | ICD-10-CM

## 2022-10-26 MED ORDER — PEG 3350-KCL-NA BICARB-NACL 420 G PO SOLR: 4000.0000 mL | Freq: Once | ORAL | 0 refills | Status: AC

## 2022-10-26 NOTE — Addendum Note (Signed)
Addended by: Marlowe Shores on: 10/26/2022 11:23 AM   Modules accepted: Orders

## 2022-10-26 NOTE — Telephone Encounter (Signed)
Pt contacted and scheduled for 11/02/22 at 12:45 pm with Dr.Castaneda. Prep sent to pharmacy. Instructions up front for pick up

## 2022-11-02 ENCOUNTER — Encounter (HOSPITAL_COMMUNITY): Payer: Self-pay | Admitting: Gastroenterology

## 2022-11-02 ENCOUNTER — Ambulatory Visit (HOSPITAL_COMMUNITY): Payer: Medicare Other | Admitting: Anesthesiology

## 2022-11-02 ENCOUNTER — Encounter (HOSPITAL_COMMUNITY)
Admission: RE | Disposition: A | Discharge status: Home / Self Care | Payer: Self-pay | Source: Home / Self Care | Attending: Gastroenterology

## 2022-11-02 ENCOUNTER — Ambulatory Visit (HOSPITAL_COMMUNITY)
Admission: RE | Admit: 2022-11-02 | Discharge: 2022-11-02 | Disposition: A | Discharge status: Home / Self Care | Payer: Medicare Other | Attending: Gastroenterology | Admitting: Gastroenterology

## 2022-11-02 ENCOUNTER — Other Ambulatory Visit: Payer: Self-pay

## 2022-11-02 DIAGNOSIS — K529 Noninfective gastroenteritis and colitis, unspecified: Secondary | ICD-10-CM

## 2022-11-02 DIAGNOSIS — K633 Ulcer of intestine: Secondary | ICD-10-CM | POA: Diagnosis not present

## 2022-11-02 DIAGNOSIS — D126 Benign neoplasm of colon, unspecified: Secondary | ICD-10-CM | POA: Diagnosis not present

## 2022-11-02 DIAGNOSIS — K648 Other hemorrhoids: Secondary | ICD-10-CM

## 2022-11-02 DIAGNOSIS — D122 Benign neoplasm of ascending colon: Secondary | ICD-10-CM | POA: Diagnosis not present

## 2022-11-02 DIAGNOSIS — R194 Change in bowel habit: Secondary | ICD-10-CM

## 2022-11-02 DIAGNOSIS — R933 Abnormal findings on diagnostic imaging of other parts of digestive tract: Secondary | ICD-10-CM

## 2022-11-02 HISTORY — PX: BIOPSY: SHX5522

## 2022-11-02 HISTORY — PX: POLYPECTOMY: SHX5525

## 2022-11-02 HISTORY — PX: COLONOSCOPY WITH PROPOFOL: SHX5780

## 2022-11-02 LAB — GLUCOSE, CAPILLARY: Glucose-Capillary: 75 mg/dL (ref 70–99)

## 2022-11-02 LAB — HM COLONOSCOPY

## 2022-11-02 SURGERY — COLONOSCOPY WITH PROPOFOL
Anesthesia: General

## 2022-11-02 MED ORDER — PROPOFOL 10 MG/ML IV BOLUS
INTRAVENOUS | Status: DC | PRN
  Administered 2022-11-02: 60 mg via INTRAVENOUS

## 2022-11-02 MED ORDER — PROPOFOL 500 MG/50ML IV EMUL
INTRAVENOUS | Status: DC | PRN
  Administered 2022-11-02: 150 ug/kg/min via INTRAVENOUS

## 2022-11-02 MED ORDER — LACTATED RINGERS IV SOLN
INTRAVENOUS | Status: DC
  Administered 2022-11-02: 1000 mL via INTRAVENOUS

## 2022-11-02 MED ORDER — PROPOFOL 500 MG/50ML IV EMUL
INTRAVENOUS | Status: AC
  Filled 2022-11-02: qty 50

## 2022-11-02 NOTE — Discharge Instructions (Addendum)
You are being discharged to home.  Resume your previous diet.  We are waiting for your pathology results.  Your physician has recommended a repeat colonoscopy for surveillance based on pathology results.  Stop using high dose aspirin including Goody/BC powders, NSAIDs such as Aleve, ibuprofen, naproxen, Motrin, Voltaren or Advil (even the topical ones)

## 2022-11-02 NOTE — Anesthesia Preprocedure Evaluation (Addendum)
Anesthesia Evaluation  Patient identified by MRN, date of birth, ID band Patient awake    Reviewed: Allergy & Precautions, H&P , NPO status , Patient's Chart, lab work & pertinent test results  Airway Mallampati: II  TM Distance: >3 FB Neck ROM: Full    Dental  (+) Dental Advisory Given,  Bridge:   Pulmonary neg pulmonary ROS   Pulmonary exam normal breath sounds clear to auscultation       Cardiovascular negative cardio ROS Normal cardiovascular exam Rhythm:Regular Rate:Normal     Neuro/Psych  Headaches PSYCHIATRIC DISORDERS         GI/Hepatic Neg liver ROS,GERD  Medicated and Controlled,,  Endo/Other  diabetes, Well ControlledHypothyroidism    Renal/GU negative Renal ROS  negative genitourinary   Musculoskeletal negative musculoskeletal ROS (+)    Abdominal   Peds negative pediatric ROS (+)  Hematology negative hematology ROS (+)   Anesthesia Other Findings   Reproductive/Obstetrics negative OB ROS                             Anesthesia Physical Anesthesia Plan  ASA: 2  Anesthesia Plan: General   Post-op Pain Management: Minimal or no pain anticipated   Induction: Intravenous  PONV Risk Score and Plan: 1 and Propofol infusion  Airway Management Planned: Nasal Cannula and Natural Airway  Additional Equipment:   Intra-op Plan:   Post-operative Plan:   Informed Consent: I have reviewed the patients History and Physical, chart, labs and discussed the procedure including the risks, benefits and alternatives for the proposed anesthesia with the patient or authorized representative who has indicated his/her understanding and acceptance.     Dental advisory given  Plan Discussed with: CRNA and Surgeon  Anesthesia Plan Comments:        Anesthesia Quick Evaluation

## 2022-11-02 NOTE — Transfer of Care (Signed)
Immediate Anesthesia Transfer of Care Note  Patient: Christina Church  Procedure(s) Performed: COLONOSCOPY WITH PROPOFOL BIOPSY POLYPECTOMY  Patient Location: Endoscopy Unit  Anesthesia Type:General  Level of Consciousness: awake, alert , and oriented  Airway & Oxygen Therapy: Patient Spontanous Breathing  Post-op Assessment: Report given to RN, Post -op Vital signs reviewed and stable, Patient moving all extremities X 4, and Patient able to stick tongue midline  Post vital signs: Reviewed and stable  Last Vitals:  Vitals Value Taken Time  BP 97/50   Temp 97.6   Pulse 74   Resp 19   SpO2 97     Last Pain:  Vitals:   11/02/22 1129  TempSrc: Oral  PainSc: 0-No pain      Patients Stated Pain Goal: 9 (11/02/22 1129)  Complications: No notable events documented.

## 2022-11-02 NOTE — Op Note (Addendum)
[facility] Patient Name: Christina Church Procedure Date: 11/02/2022 12:04 PM MRN: [id number] Date of Birth: 1956/01/19 Attending MD: Katrinka Blazing , , [id number] CSN: [id number] Age: 67 Admit Type: Outpatient Procedure:                Colonoscopy Indications:              Change in bowel habits, history of cecal erosions                            in previous colonoscopy Providers:                Katrinka Blazing, Edrick Kins, RN, Lennice Sites                            Technician, Technician Referring MD:              Medicines:                Monitored Anesthesia Care Complications:            No immediate complications. Estimated Blood Loss:     Estimated blood loss: none. Procedure:                Pre-Anesthesia Assessment:                           - Prior to the procedure, a History and Physical                            was performed, and patient medications, allergies                            and sensitivities were reviewed. The patient's                            tolerance of previous anesthesia was reviewed.                           - The risks and benefits of the procedure and the                            sedation options and risks were discussed with the                            patient. All questions were answered and informed                            consent was obtained.                           - ASA Grade Assessment: II - A patient with mild                            systemic disease.                           After obtaining informed consent, the colonoscope  was passed under direct vision. Throughout the                            procedure, the patient's blood pressure, pulse, and                            oxygen saturations were monitored continuously. The                            [id number]-[id number] ([id number]) scope was introduced through                            the anus and advanced to the the terminal ileum.                             The colonoscopy was performed without difficulty.                            The patient tolerated the procedure well. The                            quality of the bowel preparation was excellent. Scope In: 1:06:50 PM Scope Out: 1:25:50 PM Scope Withdrawal Time: 0 hours 13 minutes 40 seconds  Total Procedure Duration: 0 hours 19 minutes 0 seconds  Findings:      External hemorrhoids were found on perianal exam.      The terminal ileum appeared normal.      A few localized non-bleeding erosions were found in the cecum. Biopsies       were taken with a cold forceps for histology.      A 4 mm polyp was found in the ascending colon. The polyp was sessile.       The polyp was removed with a cold snare. Resection and retrieval were       complete.      Non-bleeding internal hemorrhoids were found during retroflexion. The       hemorrhoids were small. Impression:               - Hemorrhoids found on perianal exam.                           - The examined portion of the ileum was normal.                           - A few erosions in the cecum. Biopsied.                           - One 4 mm polyp in the ascending colon, removed                            with a cold snare. Resected and retrieved.                           - Non-bleeding internal hemorrhoids. Moderate Sedation:      Per Anesthesia Care Recommendation:           -  Discharge patient to home (ambulatory).                           - Resume previous diet.                           - Await pathology results.                           - Repeat colonoscopy for surveillance based on                            pathology results.                           -Stop using high dose aspirin including Goody/BC                            powders, NSAIDs such as Aleve, ibuprofen, naproxen,                            Motrin, Voltaren or Advil (even the topical ones) Procedure Code(s):        --- Professional ---                            [phone], Colonoscopy, flexible; with removal of                            tumor(s), polyp(s), or other lesion(s) by snare                            technique                           45380, 59, Colonoscopy, flexible; with biopsy,                            single or multiple Diagnosis Code(s):        --- Professional ---                           K64.8, Other hemorrhoids                           K63.3, Ulcer of intestine                           D12.2, Benign neoplasm of ascending colon                           R19.4, Change in bowel habit CPT copyright 2022 American Medical Association. All rights reserved. The codes documented in this report are preliminary and upon coder review may  be revised to meet current compliance requirements. Katrinka Blazing, MD Katrinka Blazing,  11/02/2022 1:35:36 PM This report has been signed electronically. Number of Addenda: 0

## 2022-11-02 NOTE — H&P (Signed)
Christina Church is an 67 y.o. female.   Chief Complaint: change in bowel habits, abnormal colonsocopy HPI: 67 year old female with past medical history of diabetes, migraines, who comes for changes in her bowel movements and abnormal colonoscopy in the past.  Patient reports that she has had previous fluctuation in her bowel movements characterized by constipation with some episodes of diarrhea.  No rectal bleeding recently.  No nausea, vomiting, abdominal pain or distention.  She reports that she has been using NSAIDs chronically on a daily basis for body aches but she stopped recently after she retired, last dose of NSAIDs was a week ago.  Past Medical History:  Diagnosis Date   Diabetes (HCC)    Elevated hemoglobin A1c 07/07/2021   level 6.0.  see scanned report.   Migraines    Post-menopausal bleeding 01/2018    Past Surgical History:  Procedure Laterality Date   BIOPSY  09/14/2022   Procedure: BIOPSY;  Surgeon: Dolores Frame, MD;  [facility]: [facility];  Service: Gastroenterology;;   CARPAL TUNNEL RELEASE     right and left hand   cataract surgery Bilateral    COLON BIOPSY     DILATATION & CURETTAGE/HYSTEROSCOPY WITH MYOSURE N/A 04/04/2018   Procedure: DILATATION & CURETTAGE/HYSTEROSCOPY WITH MYOSURE;  Surgeon: Dara Lords, MD;  Location: [facility];  Service: Gynecology;  Laterality: N/A;  request 7:30am OR time in [place] Block requests one hour OR time   ESOPHAGEAL DILATION N/A 09/14/2022   Procedure: ESOPHAGEAL DILATION;  Surgeon: Dolores Frame, MD;  [facility];  Service: Gastroenterology;  Laterality: N/A;  1:00PM;ASA 2   ESOPHAGOGASTRODUODENOSCOPY (EGD) WITH PROPOFOL N/A 09/14/2022   Procedure: ESOPHAGOGASTRODUODENOSCOPY (EGD) WITH PROPOFOL;  Surgeon: Dolores Frame, MD;  [facility];  Service: Gastroenterology;  Laterality: N/A;  1:00PM;ASA 2   TONSILLECTOMY     age 37    Family  History  Problem Relation Age of Onset   Diabetes Mother    Osteoporosis Mother    Lung cancer Mother    Stroke Mother    Hypertension Father    Lung cancer Father    Stroke Father    Breast cancer Maternal Aunt 59   Cancer Maternal Aunt        BONE AND LUNG   Social History:  reports that she has never smoked. She has never been exposed to tobacco smoke. She has never used smokeless tobacco. She reports that she does not drink alcohol and does not use drugs.  Allergies:  Allergies  Allergen Reactions   Codeine-Guaifenesin Other (See Comments)    "Disoriented"   Meclizine Other (See Comments)    Disoriented     Medications Prior to Admission  Medication Sig Dispense Refill   acetaminophen (TYLENOL) 500 MG tablet Take 500-1,000 mg by mouth every 8 (eight) hours as needed for moderate pain.     OVER THE COUNTER MEDICATION Take 2 capsules by mouth daily. Eye promise EZ tears     Povidone, PF, (IVIZIA DRY EYES) 0.5 % SOLN Place 1 drop into both eyes 2 (two) times daily.     Cholecalciferol (VITAMIN D3 GUMMIES) 25 MCG (1000 UT) CHEW Chew 2,000 Units by mouth daily. Infusion     levothyroxine (SYNTHROID) 25 MCG tablet Take 25 mcg by mouth daily before breakfast.     omeprazole (PRILOSEC) 40 MG capsule Take 1 capsule (40 mg total) by mouth daily. 60 capsule 2   PARoxetine (PAXIL-CR) 12.5 MG 24  hr tablet 1 tab(s) orally once a day (in the morning) 90 tablet 2   progesterone (PROMETRIUM) 100 MG capsule Take 1 capsule (100 mg total) by mouth daily. Take at bedtime.  Stop this medication after 3 months. 90 capsule 0   SUMAtriptan (IMITREX) 50 MG tablet Take 50 mg by mouth every 2 (two) hours as needed for migraine. May repeat in 2 hours if headache persists or recurs.      Results for orders placed or performed during the hospital encounter of 11/02/22 (from the past 48 hour(s))  Glucose, capillary     Status: None   Collection Time: 11/02/22 11:39 AM  Result Value Ref Range    Glucose-Capillary 75 70 - 99 mg/dL    Comment: Glucose reference range applies only to samples taken after fasting for at least 8 hours.   No results found.  Review of Systems  Gastrointestinal:  Positive for constipation and diarrhea.  All other systems reviewed and are negative.   Blood pressure (!) 134/58, pulse 78, temperature 98.2 F (36.8 C), temperature source Oral, resp. rate 18, height 5\' 4"  (1.626 m), weight 71.7 kg, last menstrual period 11/13/2007, SpO2 98%. Physical Exam  GENERAL: The patient is AO x3, in no acute distress. HEENT: Head is normocephalic and atraumatic. EOMI are intact. Mouth is well hydrated and without lesions. NECK: Supple. No masses LUNGS: Clear to auscultation. No presence of rhonchi/wheezing/rales. Adequate chest expansion HEART: RRR, normal s1 and s2. ABDOMEN: Soft, nontender, no guarding, no peritoneal signs, and nondistended. BS +. No masses. EXTREMITIES: Without any cyanosis, clubbing, rash, lesions or edema. NEUROLOGIC: AOx3, no focal motor deficit. SKIN: no jaundice, no rashes  Assessment/Plan 67 year old female with past medical history of diabetes, migraines, who comes for changes in her bowel movements and abnormal colonoscopy in the past.  Will proceed with colonoscopy. Dolores Frame, MD 11/02/2022, 12:21 PM

## 2022-11-02 NOTE — Anesthesia Postprocedure Evaluation (Signed)
Anesthesia Post Note  Patient: Christina Church  Procedure(s) Performed: COLONOSCOPY WITH PROPOFOL BIOPSY POLYPECTOMY  Patient location during evaluation: Phase II Anesthesia Type: General Level of consciousness: awake and alert and oriented Pain management: pain level controlled Vital Signs Assessment: post-procedure vital signs reviewed and stable Respiratory status: spontaneous breathing, nonlabored ventilation and respiratory function stable Cardiovascular status: blood pressure returned to baseline and stable Postop Assessment: no apparent nausea or vomiting Anesthetic complications: no  No notable events documented.   Last Vitals:  Vitals:   11/02/22 1129 11/02/22 1329  BP: (!) 134/58 (!) 97/50  Pulse: 78 74  Resp: 18 19  Temp: 36.8 C 36.4 C  SpO2: 98% 97%    Last Pain:  Vitals:   11/02/22 1331  TempSrc:   PainSc: 0-No pain                 Felicha Frayne C Yamna Mackel

## 2022-11-05 ENCOUNTER — Encounter (INDEPENDENT_AMBULATORY_CARE_PROVIDER_SITE_OTHER): Payer: Self-pay | Admitting: *Deleted

## 2022-11-05 LAB — SURGICAL PATHOLOGY

## 2022-11-06 ENCOUNTER — Encounter (HOSPITAL_COMMUNITY): Payer: Self-pay | Admitting: Gastroenterology

## 2022-11-20 ENCOUNTER — Telehealth (INDEPENDENT_AMBULATORY_CARE_PROVIDER_SITE_OTHER): Payer: Self-pay | Admitting: Gastroenterology

## 2022-11-20 NOTE — Telephone Encounter (Signed)
Reminder file from 2 months ago  Marlowe Shores, LPN  Marlowe Shores, LPN I reviewed the pathology results. I called the patient and informed  about the results. Pathology showed presence of hyperplastic polyps in the stomach x 3 and esophageal biopsies consistent with reflux, repeat EGD in 3 months for polypectomy  Lequita Meadowcroft, please schedule her in room 1.  Thanks,  Katrinka Blazing, MD Gastroenterology and Hepatology [facility]

## 2022-11-21 NOTE — Telephone Encounter (Signed)
Pt contacted and scheduled for 11/28/22 with Levon Hedger. Pt will come by office to pick up instructions. No pa needed per insurance.

## 2022-11-28 ENCOUNTER — Ambulatory Visit (HOSPITAL_COMMUNITY): Payer: Medicare Other | Admitting: Anesthesiology

## 2022-11-28 ENCOUNTER — Ambulatory Visit (HOSPITAL_COMMUNITY)
Admission: RE | Admit: 2022-11-28 | Discharge: 2022-11-28 | Disposition: A | Discharge status: Home / Self Care | Payer: Medicare Other | Attending: Gastroenterology | Admitting: Gastroenterology

## 2022-11-28 ENCOUNTER — Other Ambulatory Visit: Payer: Self-pay

## 2022-11-28 ENCOUNTER — Encounter (HOSPITAL_COMMUNITY)
Admission: RE | Disposition: A | Discharge status: Home / Self Care | Payer: Self-pay | Source: Home / Self Care | Attending: Gastroenterology

## 2022-11-28 ENCOUNTER — Encounter (HOSPITAL_COMMUNITY): Payer: Self-pay | Admitting: Gastroenterology

## 2022-11-28 DIAGNOSIS — K317 Polyp of stomach and duodenum: Secondary | ICD-10-CM | POA: Diagnosis present

## 2022-11-28 DIAGNOSIS — Z833 Family history of diabetes mellitus: Secondary | ICD-10-CM | POA: Insufficient documentation

## 2022-11-28 DIAGNOSIS — K449 Diaphragmatic hernia without obstruction or gangrene: Secondary | ICD-10-CM | POA: Insufficient documentation

## 2022-11-28 DIAGNOSIS — K219 Gastro-esophageal reflux disease without esophagitis: Secondary | ICD-10-CM | POA: Insufficient documentation

## 2022-11-28 DIAGNOSIS — D131 Benign neoplasm of stomach: Secondary | ICD-10-CM

## 2022-11-28 DIAGNOSIS — E119 Type 2 diabetes mellitus without complications: Secondary | ICD-10-CM | POA: Diagnosis not present

## 2022-11-28 HISTORY — PX: POLYPECTOMY: SHX5525

## 2022-11-28 HISTORY — PX: ESOPHAGOGASTRODUODENOSCOPY (EGD) WITH PROPOFOL: SHX5813

## 2022-11-28 LAB — GLUCOSE, CAPILLARY: Glucose-Capillary: 90 mg/dL (ref 70–99)

## 2022-11-28 SURGERY — ESOPHAGOGASTRODUODENOSCOPY (EGD) WITH PROPOFOL
Anesthesia: General

## 2022-11-28 MED ORDER — PROPOFOL 10 MG/ML IV BOLUS
INTRAVENOUS | Status: DC | PRN
  Administered 2022-11-28: 50 mg via INTRAVENOUS
  Administered 2022-11-28: 100 mg via INTRAVENOUS
  Administered 2022-11-28: 30 mg via INTRAVENOUS
  Administered 2022-11-28: 50 mg via INTRAVENOUS

## 2022-11-28 MED ORDER — LACTATED RINGERS IV SOLN: INTRAVENOUS | Status: DC

## 2022-11-28 MED ORDER — LIDOCAINE HCL (CARDIAC) PF 100 MG/5ML IV SOSY
PREFILLED_SYRINGE | INTRAVENOUS | Status: DC | PRN
Start: 2022-11-28 — End: 2022-11-28
  Administered 2022-11-28: 60 mg via INTRATRACHEAL

## 2022-11-28 MED ORDER — LACTATED RINGERS IV SOLN: INTRAVENOUS | Status: DC | PRN

## 2022-11-28 MED ORDER — OMEPRAZOLE 40 MG PO CPDR: 40.0000 mg | DELAYED_RELEASE_CAPSULE | Freq: Two times a day (BID) | ORAL | 0 refills | Status: DC

## 2022-11-28 NOTE — H&P (Signed)
Christina Church is an 67 y.o. female.   Chief Complaint: hyperplastic gastric polyps HPI: 67 year old female with past medical history of diabetes, migraines, who comes for management of hyperplastic polyps.  The patient denies having any complaints such as melena, hematochezia, abdominal pain or distention, change in her bowel movement consistency or frequency, no changes in weight recently.  No family history of colorectal cancer.   Past Medical History:  Diagnosis Date   Diabetes (HCC)    Elevated hemoglobin A1c 07/07/2021   level 6.0.  see scanned report.   Migraines    Post-menopausal bleeding 01/2018    Past Surgical History:  Procedure Laterality Date   BIOPSY  09/14/2022   Procedure: BIOPSY;  Surgeon: Dolores Frame, MD;  Location: [facility];  Service: Gastroenterology;;   BIOPSY  11/02/2022   Procedure: BIOPSY;  Surgeon: Marguerita Merles, Reuel Boom, MD;  Location: [facility];  Service: Gastroenterology;;   CARPAL TUNNEL RELEASE     right and left hand   cataract surgery Bilateral    COLON BIOPSY     COLONOSCOPY WITH PROPOFOL N/A 11/02/2022   Procedure: COLONOSCOPY WITH PROPOFOL;  Surgeon: Dolores Frame, MD;  Location: [facility];  Service: Gastroenterology;  Laterality: N/A;  12:45PM;ASA 2   DILATATION & CURETTAGE/HYSTEROSCOPY WITH MYOSURE N/A 04/04/2018   Procedure: DILATATION & CURETTAGE/HYSTEROSCOPY WITH MYOSURE;  Surgeon: Dara Lords, MD;  Location: [facility];  Service: Gynecology;  Laterality: N/A;  request 7:30am OR time in [place] requests one hour OR time   ESOPHAGEAL DILATION N/A 09/14/2022   Procedure: ESOPHAGEAL DILATION;  Surgeon: Dolores Frame, MD;  Location: [facility];  Service: Gastroenterology;  Laterality: N/A;  1:00PM;ASA 2   ESOPHAGOGASTRODUODENOSCOPY (EGD) WITH PROPOFOL N/A 09/14/2022   Procedure: ESOPHAGOGASTRODUODENOSCOPY (EGD) WITH PROPOFOL;  Surgeon: Dolores Frame, MD;  Location: [facility];  Service: Gastroenterology;  Laterality: N/A;  1:00PM;ASA 2   POLYPECTOMY  11/02/2022   Procedure: POLYPECTOMY;  Surgeon: Dolores Frame, MD;  Location: [facility];  Service: Gastroenterology;;   TONSILLECTOMY     age 72    Family History  Problem Relation Age of Onset   Diabetes Mother    Osteoporosis Mother    Lung cancer Mother    Stroke Mother    Hypertension Father    Lung cancer Father    Stroke Father    Breast cancer Maternal Aunt 21   Cancer Maternal Aunt        BONE AND LUNG   Social History:  reports that she has never smoked. She has never been exposed to tobacco smoke. She has never used smokeless tobacco. She reports that she does not drink alcohol and does not use drugs.  Allergies:  Allergies  Allergen Reactions   Codeine-Guaifenesin Other (See Comments)    "Disoriented"   Meclizine Other (See Comments)    Disoriented     Medications Prior to Admission  Medication Sig Dispense Refill   acetaminophen (TYLENOL) 500 MG tablet Take 500-1,000 mg by mouth every 8 (eight) hours as needed for moderate pain.     albuterol (VENTOLIN HFA) 108 (90 Base) MCG/ACT inhaler Inhale into the lungs 3 (three) times daily.     benzonatate (TESSALON) 100 MG capsule Take by mouth 3 (three) times daily as needed for cough.     Cholecalciferol (VITAMIN D3 GUMMIES) 25 MCG (1000 UT) CHEW Chew 2,000 Units by mouth daily. Infusion     levothyroxine (SYNTHROID)  25 MCG tablet Take 25 mcg by mouth daily before breakfast.     omeprazole (PRILOSEC) 40 MG capsule Take 1 capsule (40 mg total) by mouth daily. 60 capsule 2   OVER THE COUNTER MEDICATION Take 2 capsules by mouth daily. Eye promise EZ tears     PARoxetine (PAXIL-CR) 12.5 MG 24 hr tablet 1 tab(s) orally once a day (in the morning) (Patient taking differently: 20 mg daily. 1 tab(s) orally once a day (in the morning)) 90 tablet 2   Povidone, PF, (IVIZIA DRY EYES) 0.5 % SOLN  Place 1 drop into both eyes 2 (two) times daily.     promethazine-dextromethorphan (PROMETHAZINE-DM) 6.25-15 MG/5ML syrup Take by mouth 4 (four) times daily as needed for cough.     progesterone (PROMETRIUM) 100 MG capsule Take 1 capsule (100 mg total) by mouth daily. Take at bedtime.  Stop this medication after 3 months. 90 capsule 0   SUMAtriptan (IMITREX) 50 MG tablet Take 50 mg by mouth every 2 (two) hours as needed for migraine. May repeat in 2 hours if headache persists or recurs.      Results for orders placed or performed during the hospital encounter of 11/28/22 (from the past 48 hour(s))  Glucose, capillary     Status: None   Collection Time: 11/28/22  7:50 AM  Result Value Ref Range   Glucose-Capillary 90 70 - 99 mg/dL    Comment: Glucose reference range applies only to samples taken after fasting for at least 8 hours.   No results found.  Review of Systems  All other systems reviewed and are negative.   Blood pressure 132/64, pulse (!) 58, temperature 98.7 F (37.1 C), temperature source Oral, resp. rate 14, height 5\' 4"  (1.626 m), weight 71.2 kg, last menstrual period 11/13/2007, SpO2 98%. Physical Exam  GENERAL: The patient is AO x3, in no acute distress. HEENT: Head is normocephalic and atraumatic. EOMI are intact. Mouth is well hydrated and without lesions. NECK: Supple. No masses LUNGS: Clear to auscultation. No presence of rhonchi/wheezing/rales. Adequate chest expansion HEART: RRR, normal s1 and s2. ABDOMEN: Soft, nontender, no guarding, no peritoneal signs, and nondistended. BS +. No masses. EXTREMITIES: Without any cyanosis, clubbing, rash, lesions or edema. NEUROLOGIC: AOx3, no focal motor deficit. SKIN: no jaundice, no rashes  Assessment/Plan  67 year old female with past medical history of diabetes, migraines, who comes for management of hyperplastic polyps.  Will proceed with EGD.  Dolores Frame, MD 11/28/2022, 8:39 AM

## 2022-11-28 NOTE — Transfer of Care (Signed)
Immediate Anesthesia Transfer of Care Note  Patient: Bayli Laumann Pett  Procedure(s) Performed: ESOPHAGOGASTRODUODENOSCOPY (EGD) WITH PROPOFOL POLYPECTOMY  Patient Location: Endoscopy Unit  Anesthesia Type:General  Level of Consciousness: awake  Airway & Oxygen Therapy: Patient Spontanous Breathing  Post-op Assessment: Report given to RN and Post -op Vital signs reviewed and stable  Post vital signs: Reviewed and stable  Last Vitals:  Vitals Value Taken Time  BP 91/41 11/28/22 0900  Temp 36.3 C 11/28/22 0900  Pulse 70 11/28/22 0900  Resp 19 11/28/22 0900  SpO2 94 % 11/28/22 0900    Last Pain:  Vitals:   11/28/22 0902  TempSrc:   PainSc: 0-No pain      Patients Stated Pain Goal: 8 (11/28/22 0745)  Complications: No notable events documented.

## 2022-11-28 NOTE — Discharge Instructions (Addendum)
You are being discharged to home.  Resume your previous diet.  We are waiting for your pathology results.  Take Prilosec (omeprazole) 40 mg by mouth twice a day for two months, then decrease to once a day.

## 2022-11-28 NOTE — Op Note (Signed)
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 Estimated Blood Loss:     Estimated blood loss: none. Procedure:                Pre-Anesthesia Assessment:                           - Prior to the procedure, a History and Physical                            was performed, and patient medications, allergies                            and sensitivities were reviewed. The patient's                            tolerance of previous anesthesia was reviewed.                           - The risks and benefits of the procedure and the                            sedation options and risks were discussed with the                            patient. All questions were answered and informed                            consent was obtained.                           - ASA Grade Assessment: II - A patient with mild                            systemic disease.                           After obtaining informed consent, the endoscope was                            passed under direct vision. Throughout  the                            procedure, the patient's blood pressure, pulse, and                            oxygen saturations were monitored continuously. The                            [id number]-H190 ([id number]) scope was introduced through the                            mouth, and advanced to the second part of duodenum.                            The upper GI endoscopy was accomplished  without                            difficulty. The patient tolerated the procedure                            well. Scope In: 8:45:35 AM Scope Out: 8:57:32 AM Total Procedure Duration: 0 hours 11 minutes 57 seconds  Findings:      A 3 cm hiatal hernia was present.      Two 5 to 8 mm sessile polyps with no bleeding and no stigmata of recent       bleeding were found in the cardia. These polyps were removed with a hot       snare. Resection and retrieval were complete.      The examined duodenum was normal. Impression:               - 3 cm hiatal hernia.                           - Two gastric polyps. Resected and retrieved.                           - Normal examined duodenum. Moderate Sedation:      Per Anesthesia Care Recommendation:           - Discharge patient to home (ambulatory).                           - Resume previous diet.                           - Await pathology results.                           - Use Prilosec (omeprazole) 40 mg PO BID for 2                            months, then decrease to once a day. Procedure Code(s):        --- Professional ---  43251, Esophagogastroduodenoscopy, flexible,                            transoral; with removal of tumor(s), polyp(s), or                            other lesion(s) by snare technique Diagnosis Code(s):        --- Professional ---                           K44.9, Diaphragmatic hernia without obstruction or                            gangrene                           K31.7, Polyp of stomach and duodenum                           D13.1, Benign neoplasm of stomach CPT copyright 2022 [employer]. All rights reserved. The codes documented in this report are preliminary and upon coder review may  be revised to meet current compliance requirements. Katrinka Blazing, MD Katrinka Blazing,  11/28/2022 9:05:33 AM This report has been signed electronically. Number of Addenda: 0

## 2022-11-28 NOTE — Anesthesia Preprocedure Evaluation (Signed)
Anesthesia Evaluation  Patient identified by MRN, date of birth, ID band Patient awake    Reviewed: Allergy & Precautions, H&P , NPO status , Patient's Chart, lab work & pertinent test results, reviewed documented beta blocker date and time   Airway Mallampati: II  TM Distance: >3 FB Neck ROM: full    Dental no notable dental hx.    Pulmonary neg pulmonary ROS   Pulmonary exam normal breath sounds clear to auscultation       Cardiovascular Exercise Tolerance: Good negative cardio ROS  Rhythm:regular Rate:Normal     Neuro/Psych  Headaches PSYCHIATRIC DISORDERS      negative neurological ROS  negative psych ROS   GI/Hepatic negative GI ROS, Neg liver ROS,GERD  ,,  Endo/Other  negative endocrine ROSdiabetes    Renal/GU negative Renal ROS  negative genitourinary   Musculoskeletal   Abdominal   Peds  Hematology negative hematology ROS (+)   Anesthesia Other Findings   Reproductive/Obstetrics negative OB ROS                             Anesthesia Physical Anesthesia Plan  ASA: 2  Anesthesia Plan: General   Post-op Pain Management:    Induction:   PONV Risk Score and Plan: TIVA  Airway Management Planned:   Additional Equipment:   Intra-op Plan:   Post-operative Plan:   Informed Consent: I have reviewed the patients History and Physical, chart, labs and discussed the procedure including the risks, benefits and alternatives for the proposed anesthesia with the patient or authorized representative who has indicated his/her understanding and acceptance.     Dental Advisory Given  Plan Discussed with: CRNA  Anesthesia Plan Comments:        Anesthesia Quick Evaluation

## 2022-11-29 NOTE — Anesthesia Postprocedure Evaluation (Signed)
Anesthesia Post Note  Patient: Christina Church  Procedure(s) Performed: ESOPHAGOGASTRODUODENOSCOPY (EGD) WITH PROPOFOL POLYPECTOMY  Patient location during evaluation: Phase II Anesthesia Type: General Level of consciousness: awake Pain management: pain level controlled Vital Signs Assessment: post-procedure vital signs reviewed and stable Respiratory status: spontaneous breathing and respiratory function stable Cardiovascular status: blood pressure returned to baseline and stable Postop Assessment: no headache and no apparent nausea or vomiting Anesthetic complications: no Comments: Late entry   No notable events documented.   Last Vitals:  Vitals:   11/28/22 0745 11/28/22 0900  BP: 132/64 (!) 91/41  Pulse: (!) 58 70  Resp: 14 19  Temp: 37.1 C (!) 36.3 C  SpO2: 98% 94%    Last Pain:  Vitals:   11/28/22 0902  TempSrc:   PainSc: 0-No pain                 Windell Norfolk

## 2022-12-03 LAB — SURGICAL PATHOLOGY

## 2022-12-04 ENCOUNTER — Encounter (INDEPENDENT_AMBULATORY_CARE_PROVIDER_SITE_OTHER): Payer: Self-pay | Admitting: *Deleted

## 2022-12-07 ENCOUNTER — Encounter (HOSPITAL_COMMUNITY): Payer: Self-pay | Admitting: Gastroenterology

## 2022-12-25 ENCOUNTER — Ambulatory Visit (INDEPENDENT_AMBULATORY_CARE_PROVIDER_SITE_OTHER): Payer: Medicare Other | Admitting: Gastroenterology

## 2022-12-25 ENCOUNTER — Encounter (INDEPENDENT_AMBULATORY_CARE_PROVIDER_SITE_OTHER): Payer: Self-pay | Admitting: Gastroenterology

## 2022-12-25 VITALS — BP 135/79 | HR 56 | Temp 97.8°F | Ht 64.0 in | Wt 157.8 lb

## 2022-12-25 DIAGNOSIS — R933 Abnormal findings on diagnostic imaging of other parts of digestive tract: Secondary | ICD-10-CM

## 2022-12-25 DIAGNOSIS — K649 Unspecified hemorrhoids: Secondary | ICD-10-CM | POA: Diagnosis not present

## 2022-12-25 DIAGNOSIS — K219 Gastro-esophageal reflux disease without esophagitis: Secondary | ICD-10-CM

## 2022-12-25 DIAGNOSIS — R140 Abdominal distension (gaseous): Secondary | ICD-10-CM

## 2022-12-25 DIAGNOSIS — R1032 Left lower quadrant pain: Secondary | ICD-10-CM | POA: Diagnosis not present

## 2022-12-25 NOTE — Progress Notes (Signed)
Referring Provider: Benita Stabile, MD Primary Care Physician:  Leone Payor, FNP Primary GI Physician: Dr. Levon Hedger   Chief Complaint  Patient presents with   Follow-up    Patient here today for a follow visit. Patient does have occasional abdominal pains.   HPI:   Christina Church is a 67 y.o. female with past medical history of DM, migraines, sleep apnea    Patient presenting today for follow up of GERD/dysphagia   Last seen June 2024, at that time feeling unable to swallow well, liquids and food sticking in throat. Taking protonix 40mg  daily, on dexilant in the past. Mix of constipation and diarrhea.   Recommended start omeprazole 40mg  daily, schedule EGD, review Colonoscopy results from 2021, consider repeat pending biopsy results   Patient underwent EGD in June which showed concern for BE, three gastric polyps, repeat EGD in September as outlined below.   She underwent SLP swallow evaluation on 10/08/22 as outlined below  Present: Dysphagia has resolved since EGD. Denies any GERD symptoms, she often misses her second dose of PPI. Denies nausea or vomiting. Appetite and weight are stable.   She notes that she has some occasional abdominal pain at times, can be periumbilical or LLQ. Unsure of precipitating factors, sometimes defecation improves her pain. She has some blood on toilet tissue at times as well as rectal itching, feels this is related to her hemorrhoids.  She is not currently using anything for her hemorrhoids. She is having 1 BM per day, usually every morning. Sometimes will have another BM later in the day. She can still have a mix of both constipation and looser stools, tends to depend on what she eats. Looser stools occur very infrequently. She does note a lot of gas, she takes gas x which seems to help. No melena. Denies nausea, vomiting, early satiety or weight loss. She is not taking any NSAIDs.   SLP swallow eval: 10/08/22 Pt's oropharyngeal swallowing presents  to be within functional limits today on MBSS. Oral coordination of swallowing and timing of swallow is WFL, good laryngeal vestibule closure, good pharyngeal stripping wave and laryngeal excursion. Note occasional trace pharyngeal residue cleared with a reflexive repeat swallow. Brief stasis of the barium tablet noted in the proximal esophagus that was cleared with one additional puree bolus presentation, radiologist present to confirm. SLP reviewed universal aspiration precautions with the Pt. Recommend continue with regular diet and thin liquids. There are no further ST needs noted at this time,  Last Colonoscopy: 10/2022 - Hemorrhoids found on perianal exam.                           - The examined portion of the ileum was normal.                           - A few erosions in the cecum. Biopsied.                           - One 4 mm polyp in the ascending colon, removed                            with a cold snare. Resected and retrieved.                           -  Non-bleeding internal hemorrhoids 1 tubular adenoma.  Cecal biopsies and part of the ascending colon polypectomy site showed focal active colitis and very mild chronic changes.  I explained to the patient that this changes could be related to her NSAIDs but we will need to monitor to make sure this is not smoldering Crohn's.   Repeat colonoscopy in 1 year for surveillance and performing biopsies. Last Endoscopy:11/2022:3 cm hiatal hernia.                           - Two gastric polyps. Resected and retrieved- Hyperplastic, no repeat EGD recommended - Normal examined duodenum. (Initial EGD concerning for BE but biopsy not consistent with this)  Past Medical History:  Diagnosis Date   Diabetes (HCC)    Elevated hemoglobin A1c 07/07/2021   level 6.0.  see scanned report.   Migraines    Post-menopausal bleeding 01/2018    Past Surgical History:  Procedure Laterality Date   BIOPSY  09/14/2022   Procedure: BIOPSY;  Surgeon: Dolores Frame, MD;  [facility];  Service: Gastroenterology;;   BIOPSY  11/02/2022   Procedure: BIOPSY;  Surgeon: Marguerita Merles, Reuel Boom, MD;  [facility];  Service: Gastroenterology;;   CARPAL TUNNEL RELEASE     right and left hand   cataract surgery Bilateral    COLON BIOPSY     COLONOSCOPY WITH PROPOFOL N/A 11/02/2022   Procedure: COLONOSCOPY WITH PROPOFOL;  Surgeon: Dolores Frame, MD;  [facility];  Service: Gastroenterology;  Laterality: N/A;  12:45PM;ASA 2   DILATATION & CURETTAGE/HYSTEROSCOPY WITH MYOSURE N/A 04/04/2018   Procedure: DILATATION & CURETTAGE/HYSTEROSCOPY WITH MYOSURE;  Surgeon: Dara Lords, MD;  Location: [facility];  Service: Gynecology;  Laterality: N/A;  request 7:30am OR time in [place] Block requests one hour OR time   ESOPHAGEAL DILATION N/A 09/14/2022   Procedure: ESOPHAGEAL DILATION;  Surgeon: Dolores Frame, MD;  [facility];  Service: Gastroenterology;  Laterality: N/A;  1:00PM;ASA 2   ESOPHAGOGASTRODUODENOSCOPY (EGD) WITH PROPOFOL N/A 09/14/2022   Procedure: ESOPHAGOGASTRODUODENOSCOPY (EGD) WITH PROPOFOL;  Surgeon: Dolores Frame, MD;  [facility];  Service: Gastroenterology;  Laterality: N/A;  1:00PM;ASA 2   ESOPHAGOGASTRODUODENOSCOPY (EGD) WITH PROPOFOL N/A 11/28/2022   Procedure: ESOPHAGOGASTRODUODENOSCOPY (EGD) WITH PROPOFOL;  Surgeon: Dolores Frame, MD;  [facility];  Service: Gastroenterology;  Laterality: N/A;  8:45AM;ASA 1   POLYPECTOMY  11/02/2022   Procedure: POLYPECTOMY;  Surgeon: Dolores Frame, MD;  [facility];  Service: Gastroenterology;;   POLYPECTOMY  11/28/2022   Procedure: POLYPECTOMY;  Surgeon: Marguerita Merles, Reuel Boom, MD;  [facility];  Service: Gastroenterology;;   TONSILLECTOMY     age 81    Current Outpatient Medications  Medication Sig Dispense Refill    acetaminophen (TYLENOL) 500 MG tablet Take 500-1,000 mg by mouth every 8 (eight) hours as needed for moderate pain.     Cholecalciferol (VITAMIN D3 GUMMIES) 25 MCG (1000 UT) CHEW Chew 2,000 Units by mouth daily. Infusion     levothyroxine (SYNTHROID) 50 MCG tablet Take 50 mcg by mouth daily before breakfast.     omeprazole (PRILOSEC) 40 MG capsule Take 1 capsule (40 mg total) by mouth 2 (two) times daily. 120 capsule 0   OVER THE COUNTER MEDICATION Take 2 capsules by mouth daily. Eye promise EZ tears     PARoxetine (PAXIL-CR) 12.5 MG 24 hr  tablet 1 tab(s) orally once a day (in the morning) (Patient taking differently: 20 mg daily. 1 tab(s) orally once a day (in the morning)) 90 tablet 2   Povidone, PF, (IVIZIA DRY EYES) 0.5 % SOLN Place 1 drop into both eyes 2 (two) times daily.     progesterone (PROMETRIUM) 100 MG capsule Take 1 capsule (100 mg total) by mouth daily. Take at bedtime.  Stop this medication after 3 months. 90 capsule 0   SUMAtriptan (IMITREX) 50 MG tablet Take 50 mg by mouth every 2 (two) hours as needed for migraine. May repeat in 2 hours if headache persists or recurs.     No current facility-administered medications for this visit.    Allergies as of 12/25/2022 - Review Complete 12/25/2022  Allergen Reaction Noted   Codeine-guaifenesin Other (See Comments) 10/13/2010   Meclizine Other (See Comments) 01/03/2018    Family History  Problem Relation Age of Onset   Diabetes Mother    Osteoporosis Mother    Lung cancer Mother    Stroke Mother    Hypertension Father    Lung cancer Father    Stroke Father    Breast cancer Maternal Aunt 41   Cancer Maternal Aunt        BONE AND LUNG    Social History   Socioeconomic History   Marital status: Married    Spouse name: Not on file   Number of children: Not on file   Years of education: Not on file   Highest education level: Not on file  Occupational History   Not on file  Tobacco Use   Smoking status: Never     Passive exposure: Never   Smokeless tobacco: Never  Vaping Use   Vaping status: Never Used  Substance and Sexual Activity   Alcohol use: No    Alcohol/week: 0.0 standard drinks of alcohol   Drug use: No   Sexual activity: Not Currently    Comment: 1st intercourse 67 yo-Fewer than 5 partners  Other Topics Concern   Not on file  Social History Narrative   Not on file   Social Determinants of Health   Financial Resource Strain: Not on file  Food Insecurity: Not on file  Transportation Needs: Not on file  Physical Activity: Not on file  Stress: Not on file  Social Connections: Unknown (07/28/2021)   Received from [facility]   Social Network    Social Network: Not on file   Review of systems General: negative for malaise, night sweats, fever, chills, weight loss Neck: Negative for lumps, goiter, pain and significant neck swelling Resp: Negative for cough, wheezing, dyspnea at rest CV: Negative for chest pain, leg swelling, palpitations, orthopnea GI: denies melena, hematochezia, nausea, vomiting, diarrhea, constipation, dysphagia, odyonophagia, early satiety or unintentional weight loss. +abdominal discomfort +gas MSK: Negative for joint pain or swelling, back pain, and muscle pain. Derm: Negative for itching or rash Psych: Denies depression, anxiety, memory loss, confusion. No homicidal or suicidal ideation.  Heme: Negative for prolonged bleeding, bruising easily, and swollen nodes. Endocrine: Negative for cold or heat intolerance, polyuria, polydipsia and goiter. Neuro: negative for tremor, gait imbalance, syncope and seizures. The remainder of the review of systems is noncontributory.  Physical Exam: BP 135/79 (BP Location: Left Arm, Patient Position: Sitting, Cuff Size: Normal)   Pulse (!) 56   Temp 97.8 F (36.6 C) (Temporal)   Ht 5\' 4"  (1.626 m)   Wt 157 lb 12.8 oz (71.6 kg)  LMP 11/13/2007   BMI 27.09 kg/m  General:   Alert and oriented. No  distress noted. Pleasant and cooperative.  Head:  Normocephalic and atraumatic. Eyes:  Conjuctiva clear without scleral icterus. Mouth:  Oral mucosa pink and moist. Good dentition. No lesions. Heart: Normal rate and rhythm, s1 and s2 heart sounds present.  Lungs: Clear lung sounds in all lobes. Respirations equal and unlabored. Abdomen:  +BS, soft, non-tender and non-distended. No rebound or guarding. No HSM or masses noted. Derm: No palmar erythema or jaundice Msk:  Symmetrical without gross deformities. Normal posture. Extremities:  Without edema. Neurologic:  Alert and  oriented x4 Psych:  Alert and cooperative. Normal mood and affect.  Invalid input(s): "6 MONTHS"   ASSESSMENT: Christina Church is a 67 y.o. female presenting today for follow up of GERD/dysphagia, also with some abdominal discomfort and gas  GERD/Dysphagia: GERD well-controlled on omeprazole 40 mg once daily, she was prescribed twice daily dosing but notes she often forgets second dose.  Her dysphagia has resolved since upper endoscopy with dilation.  Recommend decreasing her personal 40 mg to once daily as she is doing well with this.  She continue with good reflux precautions.  Abdominal discomfort/gas/abnormal Colonoscopy biopsy: She notes some occasional abdominal discomfort usually around her umbilicus or left lower quadrant.  Abdominal exam today was unremarkable. Cannot pinpoint any precipitating or alleviating factors.  Typically has 1 BM per day occasionally with looser stools or constipation.    She notes some gas at times as well which she takes Gas-X for. Recent colonoscopy biopsy showed colitis, concerning for possible Crohn's versus NSAID induced colitis.  She was advised to avoid all NSAIDs. As her symptoms are mild, diarrhea is very infrequent, will continue to monitor and repeat Colonoscopy in 1 year. I advised her to continue gas x PRN and can also start a daily probiotic.   Hemorrhoids: as noted on  recently colonoscopy, she has some toilet tissue hematochezia and itching. Will send CA hemorrhoid compound cream. Advised to avoid straining and limit toilet time.    PLAN:  Decrease omeprazole 40mg  to once daily  2.  Repeat colonoscopy in 1 year 3. Can continue with gas x 4.  [facility] hemorrhoid cream, avoid straining, limit toilet time 5. Start daily probiotic   All questions were answered, patient verbalized understanding and is in agreement with plan as outlined above.   Follow Up: 6 months   Chandra Feger L. Jeanmarie Hubert, MSN, APRN, AGNP-C Adult-Gerontology Nurse Practitioner [facility]  I have reviewed the note and agree with the APP's assessment as described in this progress note  Unclear if the patient had NSAID induced colitis versus smoldering Crohn's disease, this may become more clear with a repeat colonoscopy in a year.  She should abstain from using NSAIDs for now.  Katrinka Blazing, MD Gastroenterology and Hepatology [facility]

## 2022-12-25 NOTE — Patient Instructions (Signed)
Decrease omeprazole 40mg  to once daily  Can continue with gas x as needed  I will call in [facility] hemorrhoid cream to use 4 times per day, make sure to avoid straining and limit sitting on the toilet for long periods of time  You can try starting an over the counter daily probiotic, aim for one with atleast 2-3 strains of bacteria Please avoid NSAIDs (advil, aleve, naproxen, goody powder, ibuprofen) as these can be very hard on your GI tract, causing inflammation, ulcers and damage to the lining of your GI tract.  We will Repeat colonoscopy in 1 year Follow up 6 months  It was a pleasure to see you today. I want to create trusting relationships with patients and provide genuine, compassionate, and quality care. I truly value your feedback! please be on the lookout for a survey regarding your visit with me today. I appreciate your input about our visit and your time in completing this!    Ayisha Pol L. Jeanmarie Hubert, MSN, APRN, AGNP-C Adult-Gerontology Nurse Practitioner [facility] at [facility]

## 2023-02-18 NOTE — Progress Notes (Signed)
67 y.o. [id number] Married Caucasian female here for a breast and pelvic exam.    The patient is also followed for HRT and anxiety treated with Paxil CR. Her PCP has prescribed Paxil 20 mg daily.  Can feel overwhelmed at times.  She wants be to take over her prescription for this. Has support of her husband and her church.   She has been taking Prometrium only and stopped estradiol after her last visit.  She thought that she was to continue the Prometrium.   New dx of DM type II.  Helps to care for her sister in law.  Some challenges.   PCP: Benita Stabile, MD   Patient's last menstrual period was 11/13/2007.           Sexually active: No.  The current method of family planning is post menopausal status.    Menopausal hormone therapy:  progesterone Exercising: Yes.     Walking, daily activity Smoker:  no  OB History     Gravida  1   Para  1   Term  1   Preterm      AB      Living  1      SAB      IAB      Ectopic      Multiple      Live Births  1           HEALTH MAINTENANCE: Last 2 paps: 01-03-18 Neg:Neg HR HPV, 11-19-13 Neg:Neg HR HPV  History of abnormal Pap or positive HPV:  no Mammogram:  scheduled 03/04/23 at [facility], 02/15/22 Breast Density Cat B, BI-RADS CAT 1 neg Colonoscopy:  11/02/22 - possible Crohn's.  Will have colonoscopy again next year.   [place].    Bone Density:  01/19/16  Result  normal   Immunization History  Administered Date(s) Administered   Influenza,inj,Quad PF,6+ Mos 12/23/2015, 12/28/2016, 01/03/2018, 12/02/2018, 02/01/2021   Tdap 11/07/2012      reports that she has never smoked. She has never been exposed to tobacco smoke. She has never used smokeless tobacco. She reports that she does not drink alcohol and does not use drugs.  Past Medical History:  Diagnosis Date   Diabetes (HCC)    Elevated hemoglobin A1c 07/07/2021   level 6.0.  see scanned report.   Migraines    Post-menopausal bleeding 01/2018     Past Surgical History:  Procedure Laterality Date   BIOPSY  09/14/2022   Procedure: BIOPSY;  Surgeon: Dolores Frame, MD;  [facility];  Service: Gastroenterology;;   BIOPSY  11/02/2022   Procedure: BIOPSY;  Surgeon: Marguerita Merles, Reuel Boom, MD;  [facility];  Service: Gastroenterology;;   CARPAL TUNNEL RELEASE     right and left hand   cataract surgery Bilateral    COLON BIOPSY     COLONOSCOPY WITH PROPOFOL N/A 11/02/2022   Procedure: COLONOSCOPY WITH PROPOFOL;  Surgeon: Dolores Frame, MD;  [facility];  Service: Gastroenterology;  Laterality: N/A;  12:45PM;ASA 2   DILATATION & CURETTAGE/HYSTEROSCOPY WITH MYOSURE N/A 04/04/2018   Procedure: DILATATION & CURETTAGE/HYSTEROSCOPY WITH MYOSURE;  Surgeon: Dara Lords, MD;  Location: [facility];  Service: Gynecology;  Laterality: N/A;  request 7:30am OR time in [place] Block requests one hour OR time   ESOPHAGEAL DILATION N/A 09/14/2022   Procedure: ESOPHAGEAL DILATION;  Surgeon: Dolores Frame, MD;  [facility];  Service: Gastroenterology;  Laterality: N/A;  1:00PM;ASA 2   ESOPHAGOGASTRODUODENOSCOPY (EGD) WITH PROPOFOL N/A 09/14/2022   Procedure: ESOPHAGOGASTRODUODENOSCOPY (EGD) WITH PROPOFOL;  Surgeon: Dolores Frame, MD;  [facility];  Service: Gastroenterology;  Laterality: N/A;  1:00PM;ASA 2   ESOPHAGOGASTRODUODENOSCOPY (EGD) WITH PROPOFOL N/A 11/28/2022   Procedure: ESOPHAGOGASTRODUODENOSCOPY (EGD) WITH PROPOFOL;  Surgeon: Dolores Frame, MD;  [facility];  Service: Gastroenterology;  Laterality: N/A;  8:45AM;ASA 1   POLYPECTOMY  11/02/2022   Procedure: POLYPECTOMY;  Surgeon: Dolores Frame, MD;  [facility];  Service: Gastroenterology;;   POLYPECTOMY  11/28/2022   Procedure: POLYPECTOMY;  Surgeon: Marguerita Merles, Reuel Boom, MD;  [facility];  Service:  Gastroenterology;;   TONSILLECTOMY     age 68    Current Outpatient Medications  Medication Sig Dispense Refill   acetaminophen (TYLENOL) 500 MG tablet Take 500-1,000 mg by mouth every 8 (eight) hours as needed for moderate pain.     Cholecalciferol (VITAMIN D3 GUMMIES) 25 MCG (1000 UT) CHEW Chew 2,000 Units by mouth daily. Infusion     levothyroxine (SYNTHROID) 50 MCG tablet Take 50 mcg by mouth daily before breakfast.     omeprazole (PRILOSEC) 40 MG capsule Take 1 capsule (40 mg total) by mouth 2 (two) times daily. 120 capsule 0   OVER THE COUNTER MEDICATION Take 2 capsules by mouth daily. Eye promise EZ tears     OVER THE COUNTER MEDICATION [facility] Hemorrhoid Cream Apply rectally up to four times per day as directed. Dispense with rectal applicator tip for ease of use. With 1 refill.     PARoxetine (PAXIL) 20 MG tablet Take 1 tablet (20 mg total) by mouth daily. 90 tablet 3   Povidone, PF, (IVIZIA DRY EYES) 0.5 % SOLN Place 1 drop into both eyes 2 (two) times daily.     progesterone (PROMETRIUM) 100 MG capsule Take 1 capsule (100 mg total) by mouth daily. Take at bedtime.  Stop this medication after 3 months. 90 capsule 0   SUMAtriptan (IMITREX) 50 MG tablet Take 50 mg by mouth every 2 (two) hours as needed for migraine. May repeat in 2 hours if headache persists or recurs.     No current facility-administered medications for this visit.    ALLERGIES: Codeine-guaifenesin and Meclizine   Family History  Problem Relation Age of Onset   Diabetes Mother    Osteoporosis Mother    Lung cancer Mother    Stroke Mother    Hypertension Father    Lung cancer Father    Stroke Father    Breast cancer Maternal Aunt 55   Cancer Maternal Aunt        BONE AND LUNG    Review of Systems  All other systems reviewed and are negative.   PHYSICAL EXAM:  BP 128/82 (BP Location: Right Arm, Patient Position: Sitting, Cuff Size: Small)   Pulse 98   Ht 5' 4.5" (1.638 m)   Wt 154 lb  (69.9 kg)   LMP 11/13/2007   SpO2 98%   BMI 26.03 kg/m     General appearance: alert, cooperative and appears stated age Head: normocephalic, without obvious abnormality, atraumatic Neck: no adenopathy, supple, symmetrical, trachea midline and thyroid normal to inspection and palpation Lungs: clear to auscultation bilaterally Breasts: normal appearance, no masses or tenderness, No nipple retraction or dimpling, No nipple discharge or bleeding, No axillary adenopathy Heart: regular rate and rhythm Abdomen: soft, non-tender; no masses, no organomegaly Extremities: extremities normal, atraumatic, no cyanosis or edema  Skin: skin color, texture, turgor normal. No rashes or lesions Lymph nodes: cervical, supraclavicular, and axillary nodes normal. Neurologic: grossly normal  Pelvic: External genitalia:  no lesions              No abnormal inguinal nodes palpated.              Urethra:  normal appearing urethra with no masses, tenderness or lesions              Bartholins and Skenes: normal                 Vagina: normal appearing vagina with normal color and discharge, no lesions              Cervix: no lesions              Pap taken: Yes.   Bimanual Exam:  Uterus:  normal size, contour, position, consistency, mobility, non-tender              Adnexa: no mass, fullness, tenderness              Rectal exam: Yes.  .  Confirms.              Anus:  normal sphincter tone, no lesions  Chaperone was present for exam:  Warren Lacy, CMA  ASSESSMENT: Encounter for breast and pelvic exam.  Cervical cancer screening.  Anxiety.  On Paxil 20 mg daily.  Menopausal female. Osteoporosis screening.   PLAN: Mammogram screening discussed. Self breast awareness reviewed. Pap and HRV collected:  Yes.   Guidelines for Calcium, Vitamin D, regular exercise program including cardiovascular and weight bearing exercise. Medication refills:  Paxil 20 mg daily.  #90, RF 3.  After I sent in the prescription, she  decided that she may have her PCP continue this prescription.  I explained that she can request this of her PCP at her next visit.  BMD ordered at [facility].  Follow up:  2 years for breast and pelvic exam and prn.    30 min  total time was spent for this patient encounter, including preparation, face-to-face counseling with the patient, coordination of care, and documentation of the encounter in addition to doing the breast and pelvic exam and pap.

## 2023-03-04 ENCOUNTER — Other Ambulatory Visit (HOSPITAL_COMMUNITY)
Admission: RE | Admit: 2023-03-04 | Discharge: 2023-03-04 | Disposition: A | Discharge status: Home / Self Care | Payer: Medicare Other | Source: Ambulatory Visit | Attending: Obstetrics and Gynecology | Admitting: Obstetrics and Gynecology

## 2023-03-04 ENCOUNTER — Ambulatory Visit
Admission: RE | Admit: 2023-03-04 | Discharge: 2023-03-04 | Disposition: A | Discharge status: Home / Self Care | Payer: Medicare Other | Source: Ambulatory Visit | Attending: Obstetrics and Gynecology | Admitting: Obstetrics and Gynecology

## 2023-03-04 ENCOUNTER — Encounter: Payer: Self-pay | Admitting: Obstetrics and Gynecology

## 2023-03-04 ENCOUNTER — Ambulatory Visit (INDEPENDENT_AMBULATORY_CARE_PROVIDER_SITE_OTHER): Payer: Medicare Other | Admitting: Obstetrics and Gynecology

## 2023-03-04 VITALS — BP 128/82 | HR 98 | Ht 64.5 in | Wt 154.0 lb

## 2023-03-04 DIAGNOSIS — Z1151 Encounter for screening for human papillomavirus (HPV): Secondary | ICD-10-CM | POA: Insufficient documentation

## 2023-03-04 DIAGNOSIS — Z124 Encounter for screening for malignant neoplasm of cervix: Secondary | ICD-10-CM | POA: Insufficient documentation

## 2023-03-04 DIAGNOSIS — Z9189 Other specified personal risk factors, not elsewhere classified: Secondary | ICD-10-CM | POA: Diagnosis not present

## 2023-03-04 DIAGNOSIS — Z01419 Encounter for gynecological examination (general) (routine) without abnormal findings: Secondary | ICD-10-CM | POA: Diagnosis present

## 2023-03-04 DIAGNOSIS — Z780 Asymptomatic menopausal state: Secondary | ICD-10-CM

## 2023-03-04 DIAGNOSIS — R87610 Atypical squamous cells of undetermined significance on cytologic smear of cervix (ASC-US): Secondary | ICD-10-CM

## 2023-03-04 DIAGNOSIS — F419 Anxiety disorder, unspecified: Secondary | ICD-10-CM | POA: Diagnosis not present

## 2023-03-04 DIAGNOSIS — Z1231 Encounter for screening mammogram for malignant neoplasm of breast: Secondary | ICD-10-CM

## 2023-03-04 MED ORDER — PAROXETINE HCL 20 MG PO TABS: 20.0000 mg | ORAL_TABLET | Freq: Every day | ORAL | 3 refills | Status: AC

## 2023-03-04 NOTE — Patient Instructions (Signed)

## 2023-03-08 LAB — CYTOLOGY - PAP
Comment: NEGATIVE
Diagnosis: NEGATIVE
Diagnosis: REACTIVE
High risk HPV: NEGATIVE

## 2023-05-27 ENCOUNTER — Other Ambulatory Visit (HOSPITAL_COMMUNITY): Payer: Self-pay | Admitting: Neurosurgery

## 2023-05-27 DIAGNOSIS — R519 Headache, unspecified: Secondary | ICD-10-CM

## 2023-06-02 ENCOUNTER — Ambulatory Visit (HOSPITAL_COMMUNITY)
Admission: RE | Admit: 2023-06-02 | Discharge: 2023-06-02 | Disposition: A | Discharge status: Home / Self Care | Source: Ambulatory Visit | Attending: Neurosurgery | Admitting: Neurosurgery

## 2023-06-02 DIAGNOSIS — R519 Headache, unspecified: Secondary | ICD-10-CM | POA: Insufficient documentation

## 2023-06-25 ENCOUNTER — Encounter (INDEPENDENT_AMBULATORY_CARE_PROVIDER_SITE_OTHER): Payer: Self-pay | Admitting: Gastroenterology

## 2023-06-25 ENCOUNTER — Ambulatory Visit (INDEPENDENT_AMBULATORY_CARE_PROVIDER_SITE_OTHER): Payer: Medicare Other | Admitting: Gastroenterology

## 2023-06-25 VITALS — BP 120/63 | HR 66 | Temp 97.8°F | Ht 64.0 in | Wt 160.9 lb

## 2023-06-25 DIAGNOSIS — K219 Gastro-esophageal reflux disease without esophagitis: Secondary | ICD-10-CM | POA: Diagnosis not present

## 2023-06-25 DIAGNOSIS — R143 Flatulence: Secondary | ICD-10-CM | POA: Diagnosis not present

## 2023-06-25 DIAGNOSIS — R1084 Generalized abdominal pain: Secondary | ICD-10-CM | POA: Insufficient documentation

## 2023-06-25 DIAGNOSIS — R140 Abdominal distension (gaseous): Secondary | ICD-10-CM | POA: Diagnosis not present

## 2023-06-25 DIAGNOSIS — R1310 Dysphagia, unspecified: Secondary | ICD-10-CM | POA: Diagnosis not present

## 2023-06-25 NOTE — Patient Instructions (Signed)
-  repeat Colonoscopy in August  -continue to avoid NSAIDs (ibuprofen, aleve, goody/bc powder, advil) -continue omeprazole 40mg  daily -we will check celiac panel and consider breath testing for bacterial overgrowth if negative -continue daily probiotic  -can try over the counter IB gard for bloating/gas  Follow up 6 months  It was a pleasure to see you today. I want to create trusting relationships with patients and provide genuine, compassionate, and quality care. I truly value your feedback! please be on the lookout for a survey regarding your visit with me today. I appreciate your input about our visit and your time in completing this!    Ahmadou Bolz L. Jeanmarie Hubert, MSN, APRN, AGNP-C Adult-Gerontology Nurse Practitioner [facility] Gastroenterology at [facility]

## 2023-06-25 NOTE — Progress Notes (Signed)
 Referring Provider: Leone Payor, FNP Primary Care Physician:  Benita Stabile, MD Primary GI Physician: Dr. Levon Hedger   Chief Complaint  Patient presents with   Follow-up    Patient her today for a follow up on Gerd. Patient says she is currently on Omeprazole 40 mg daily. She says she has time where this works for her and others it does not.    Abdominal Pain    Patient is also having issues with upper and lower abdominal pains, and gas. Patient says she was also her to follow up on whether or not she needed a colonoscopy to find out if she had UC or Crohn's.   HPI:   Christina Church is a 68 y.o. female with past medical history of DM, migraines, sleep apnea   Patient presenting today for:  Follow up of GERD, bloating, gas and abdominal pain  Last seen October 2024, at that time dysphagia had resolved since EGD.  Patient without any current symptoms, maintained on PPI twice daily.  She endorses some occasional abdominal pain usually periumbilical or left lower quadrant.  Sometimes defecation improves her pain.  Occasional toilet tissue hematochezia as well as rectal itching related to hemorrhoids.  Having a mix of constipation looser stools.  She does note more gas, taking Gas-X which seems to help.  Patient recommended to decrease omeprazole 40 mg once daily, repeat colonoscopy in August 2025, continue Gas-X, [place] [employer] hemorrhoid cream, start daily probiotic  Present: States she is doing okay, still having a lot of gas and bloating. She usually has 1 BM in the morning, typically more formed but occasionally looser. Denies rectal bleeding or melena. She has some discomfort in upper abdomen and sometimes in lower abdomen, feels that something she eats usually brings it on and pain usually resolves if she defecates or passes gas. No nausea or vomiting. She is not taking anything for gas. She takes an occasional otc "acid pill" though not sure what they are, not tums, alka seltzer or rolaids. She  has taken some ibuprofen PRN, maybe a few times per month, 400- 600mg  each time.   She is taking omeprazole 40mg  daily, she has very rare breakthrough, last episode with some regurgitation was about 1 month ago. Having occasional dysphagia if she is talking while she eats but if she does not talk usually no issues.    SLP swallow eval: 10/08/22 Pt's oropharyngeal swallowing presents to be within functional limits today on MBSS. Oral coordination of swallowing and timing of swallow is WFL, good laryngeal vestibule closure, good pharyngeal stripping wave and laryngeal excursion. Note occasional trace pharyngeal residue cleared with a reflexive repeat swallow. Brief stasis of the barium tablet noted in the proximal esophagus that was cleared with one additional puree bolus presentation, radiologist present to confirm. SLP reviewed universal aspiration precautions with the Pt. Recommend continue with regular diet and thin liquids. There are no further ST needs noted at this time,  Last Colonoscopy: 10/2022 - Hemorrhoids found on perianal exam.                           - The examined portion of the ileum was normal.                           - A few erosions in the cecum. Biopsied.                           -  One 4 mm polyp in the ascending colon, removed                            with a cold snare. Resected and retrieved.                           - Non-bleeding internal hemorrhoids 1 tubular adenoma.  Cecal biopsies and part of the ascending colon polypectomy site showed focal active colitis and very mild chronic changes.  I explained to the patient that this changes could be related to her NSAIDs but we will need to monitor to make sure this is not smoldering Crohn's.   Repeat colonoscopy in 1 year for surveillance and performing biopsies. Last Endoscopy:11/2022:3 cm hiatal hernia.                           - Two gastric polyps. Resected and retrieved- Hyperplastic, no repeat EGD recommended - Normal  examined duodenum. (Initial EGD concerning for BE but biopsy not consistent with this) Filed Weights   06/25/23 1009  Weight: 160 lb 14.4 oz (73 kg)     Past Medical History:  Diagnosis Date   Diabetes (HCC)    Elevated hemoglobin A1c 07/07/2021   level 6.0.  see scanned report.   Migraines    Post-menopausal bleeding 01/2018    Past Surgical History:  Procedure Laterality Date   BIOPSY  09/14/2022   Procedure: BIOPSY;  Surgeon: Dolores Frame, MD;  [facility];  Service: Gastroenterology;;   BIOPSY  11/02/2022   Procedure: BIOPSY;  Surgeon: Marguerita Merles, Reuel Boom, MD;  [facility];  Service: Gastroenterology;;   CARPAL TUNNEL RELEASE     right and left hand   cataract surgery Bilateral    COLON BIOPSY     COLONOSCOPY WITH PROPOFOL N/A 11/02/2022   Procedure: COLONOSCOPY WITH PROPOFOL;  Surgeon: Dolores Frame, MD;  [facility];  Service: Gastroenterology;  Laterality: N/A;  12:45PM;ASA 2   DILATATION & CURETTAGE/HYSTEROSCOPY WITH MYOSURE N/A 04/04/2018   Procedure: DILATATION & CURETTAGE/HYSTEROSCOPY WITH MYOSURE;  Surgeon: Dara Lords, MD;  Location: [facility];  Service: Gynecology;  Laterality: N/A;  request 7:30am OR time in [place] requests one hour OR time   ESOPHAGEAL DILATION N/A 09/14/2022   Procedure: ESOPHAGEAL DILATION;  Surgeon: Dolores Frame, MD;  [facility];  Service: Gastroenterology;  Laterality: N/A;  1:00PM;ASA 2   ESOPHAGOGASTRODUODENOSCOPY (EGD) WITH PROPOFOL N/A 09/14/2022   Procedure: ESOPHAGOGASTRODUODENOSCOPY (EGD) WITH PROPOFOL;  Surgeon: Dolores Frame, MD;  [facility];  Service: Gastroenterology;  Laterality: N/A;  1:00PM;ASA 2   ESOPHAGOGASTRODUODENOSCOPY (EGD) WITH PROPOFOL N/A 11/28/2022   Procedure: ESOPHAGOGASTRODUODENOSCOPY (EGD) WITH PROPOFOL;  Surgeon: Dolores Frame, MD;  [facility];  Service: Gastroenterology;  Laterality: N/A;  8:45AM;ASA 1   POLYPECTOMY  11/02/2022   Procedure: POLYPECTOMY;  Surgeon: Dolores Frame, MD;  [facility];  Service: Gastroenterology;;   POLYPECTOMY  11/28/2022   Procedure: POLYPECTOMY;  Surgeon: Marguerita Merles, Reuel Boom, MD;  [facility];  Service: Gastroenterology;;   TONSILLECTOMY     age 38    Current Outpatient Medications  Medication Sig Dispense Refill   acetaminophen (TYLENOL) 500 MG tablet Take 500-1,000 mg by mouth every 8 (eight) hours as needed for moderate pain.  Cholecalciferol (VITAMIN D3 GUMMIES) 25 MCG (1000 UT) CHEW Chew 2,000 Units by mouth daily. Infusion     levothyroxine (SYNTHROID) 50 MCG tablet Take 50 mcg by mouth daily before breakfast.     omeprazole (PRILOSEC) 40 MG capsule Take 1 capsule (40 mg total) by mouth 2 (two) times daily. 120 capsule 0   OVER THE COUNTER MEDICATION Take 2 capsules by mouth daily. [employer] promise EZ tears     OVER THE COUNTER MEDICATION [facility] Hemorrhoid Cream Apply rectally up to four times per day as directed. Dispense with rectal applicator tip for ease of use. With 1 refill.     PARoxetine (PAXIL) 20 MG tablet Take 1 tablet (20 mg total) by mouth daily. 90 tablet 3   Povidone, PF, (IVIZIA DRY EYES) 0.5 % SOLN Place 1 drop into both eyes 2 (two) times daily.     SUMAtriptan (IMITREX) 50 MG tablet Take 50 mg by mouth every 2 (two) hours as needed for migraine. May repeat in 2 hours if headache persists or recurs.     No current facility-administered medications for this visit.    Allergies as of 06/25/2023 - Review Complete 06/25/2023  Allergen Reaction Noted   Codeine-guaifenesin Other (See Comments) 10/13/2010   Meclizine Other (See Comments) 01/03/2018    Social History   Socioeconomic History   Marital status: Married    Spouse name: Not on file   Number of children: Not on file   Years of education: Not on file    Highest education level: Not on file  Occupational History   Not on file  Tobacco Use   Smoking status: Never    Passive exposure: Never   Smokeless tobacco: Never  Vaping Use   Vaping status: Never Used  Substance and Sexual Activity   Alcohol use: No    Alcohol/week: 0.0 standard drinks of alcohol   Drug use: No   Sexual activity: Not Currently    Comment: 1st intercourse 68 yo-Fewer than 5 partners, no STD, no abnormal pap  Other Topics Concern   Not on file  Social History Narrative   Not on file   Social Drivers of Health   Financial Resource Strain: Not on file  Food Insecurity: Not on file  Transportation Needs: Not on file  Physical Activity: Not on file  Stress: Not on file  Social Connections: Unknown (07/28/2021)   Received from [facility]   Social Network    Social Network: Not on file    Review of systems General: negative for malaise, night sweats, fever, chills, weight loss Neck: Negative for lumps, goiter, pain and significant neck swelling Resp: Negative for cough, wheezing, dyspnea at rest CV: Negative for chest pain, leg swelling, palpitations, orthopnea GI: denies melena, hematochezia, nausea, vomiting, diarrhea, constipation, dysphagia, odyonophagia, early satiety or unintentional weight loss. +abdominal pain +gas +bloating  The remainder of the review of systems is noncontributory.  Physical Exam: BP 120/63 (BP Location: Left Arm, Patient Position: Sitting, Cuff Size: Normal)   Pulse 66   Temp 97.8 F (36.6 C) (Temporal)   Ht 5\' 4"  (1.626 m)   Wt 160 lb 14.4 oz (73 kg)   LMP 11/13/2007   BMI 27.62 kg/m  General:   Alert and oriented. No distress noted. Pleasant and cooperative.  Head:  Normocephalic and atraumatic. Eyes:  Conjuctiva clear without scleral icterus. Mouth:  Oral mucosa pink and moist. Good dentition. No lesions. Heart: Normal rate and rhythm, s1 and s2 heart  sounds present.  Lungs: Clear lung sounds in all  lobes. Respirations equal and unlabored. Abdomen:  +BS, soft, non-tender and non-distended. No rebound or guarding. No HSM or masses noted. Neurologic:  Alert and  oriented x4 Psych:  Alert and cooperative. Normal mood and affect.  Invalid input(s): "6 MONTHS"   ASSESSMENT: Christina Church is a 68 y.o. female presenting today for follow up of GERD, abdominal pain, gas and bloating  GERD: Well managed on omeprazole 40 mg daily.  She notes 1 episode of regurgitation about a month ago.  Breakthrough symptoms are rare.  She notes occasional dysphagia if she is talking while eating but otherwise does well.  Will continue with PPI daily and good reflux precautions, she should let me know if dysphagia worsens.  Abdominal pain, gas, bloating: Endorses some ongoing gas, bloating and abdominal discomfort in upper and lower abdomen that usually resolves with passage of gas or defecation.  She has no rectal bleeding, melena, weight loss.  As outlined above she has had fairly recent colonoscopy and EGD.  At this time will check celiac panel to rule this out as underlying cause, discussed SIBO/SIMO testing with the patient which we will proceed with if celiac panel is negative.  For now she can try over-the-counter FDgard and she continue daily probiotic.  I did make her aware that FDgard could potentially worsen her GERD symptoms so she should be mindful of this.  Will need repeat colonoscopy in August due to findings on last TCS concerning for mild Crohn's vs. NSAID induced colitis. Recommended to continue to avoid NSAID as much as possible, will ensure she is on the recall list to be scheduled closer to due time.    PLAN:  -repeat Colonoscopy in August  -continue to avoid NSAIDs  -continue omeprazole 40mg  daily -celiac panel  -SIBO/SIMO testing if celiac panel negative  -continue daily probiotic  -can try IB gard for bloating, gas   All questions were answered, patient verbalized understanding and  is in agreement with plan as outlined above.   Follow Up: 6 months   Natividad Schlosser L. Jeanmarie Hubert, MSN, APRN, AGNP-C Adult-Gerontology Nurse Practitioner [facility]   I have reviewed the note and agree with the APP's assessment as described in this progress note  Katrinka Blazing, MD Gastroenterology and Hepatology [facility] Gastroenterology

## 2023-06-26 LAB — CELIAC DISEASE PANEL
(tTG) Ab, IgA: <1 U/mL
(tTG) Ab, IgG: <1 U/mL
Deamidated Gliadin Abs, IgG: <1 U/mL
Gliadin IgA: <1 U/mL
Immunoglobulin A: 80 mg/dL (ref 70–320)

## 2023-06-27 ENCOUNTER — Encounter (INDEPENDENT_AMBULATORY_CARE_PROVIDER_SITE_OTHER): Payer: Self-pay

## 2023-10-04 ENCOUNTER — Encounter (INDEPENDENT_AMBULATORY_CARE_PROVIDER_SITE_OTHER): Payer: Self-pay | Admitting: *Deleted

## 2023-10-18 ENCOUNTER — Telehealth: Payer: Self-pay

## 2023-10-18 NOTE — Telephone Encounter (Signed)
 Who is your primary care physician: Darlyn Hurst  Reasons for the colonoscopy: Hx polyps  Have you had a colonoscopy before?  Yes 11-02-2022   Do you have family history of colon cancer? no  Previous colonoscopy with polyps removed? yes  Do you have a history colorectal cancer?   no  Are you diabetic? If yes, Type 1 or Type 2?    no  Do you have a prosthetic or mechanical heart valve? no  Do you have a pacemaker/defibrillator?   no  Have you had endocarditis/atrial fibrillation? no  Have you had joint replacement within the last 12 months?  no  Do you tend to be constipated or have to use laxatives? no  Do you have any history of drugs or alchohol?  no  Do you use supplemental oxygen?  no  Have you had a stroke or heart attack within the last 6 months? no  Do you take weight loss medication?  no  For female patients: have you had a hysterectomy?  no                                     are you post menopausal?       yes                                            do you still have your menstrual cycle? no      Do you take any blood-thinning medications such as: (aspirin, warfarin, Plavix, Aggrenox)  no  If yes we need the name, milligram, dosage and who is prescribing doctor  Current Outpatient Medications on File Prior to Visit  Medication Sig Dispense Refill   acetaminophen (TYLENOL) 500 MG tablet Take 500-1,000 mg by mouth every 8 (eight) hours as needed for moderate pain.     Cholecalciferol (VITAMIN D3 GUMMIES) 25 MCG (1000 UT) CHEW Chew 2,000 Units by mouth daily. Infusion     levothyroxine (SYNTHROID) 50 MCG tablet Take 50 mcg by mouth daily before breakfast.     omeprazole  (PRILOSEC) 40 MG capsule Take 1 capsule (40 mg total) by mouth 2 (two) times daily. 120 capsule 0   PARoxetine  (PAXIL ) 20 MG tablet Take 1 tablet (20 mg total) by mouth daily. 90 tablet 3   Povidone, PF, (IVIZIA DRY EYES) 0.5 % SOLN Place 1 drop into both eyes 2 (two) times daily.     SUMAtriptan  (IMITREX) 50 MG tablet Take 50 mg by mouth every 2 (two) hours as needed for migraine. May repeat in 2 hours if headache persists or recurs.     No current facility-administered medications on file prior to visit.    Allergies  Allergen Reactions   Codeine-Guaifenesin Other (See Comments)    Disoriented   Meclizine  Other (See Comments)    Disoriented      Pharmacy: [facility] [address]  Primary Insurance Name: [employer] [employer] [id number]  Best number where you can be reached: [phone]

## 2023-10-18 NOTE — Telephone Encounter (Signed)
 Any room Thanks

## 2023-10-21 NOTE — Telephone Encounter (Signed)
 Spoke with pt. She will call back tomorrow to schedule procedure once she looks at schedule

## 2023-10-22 MED ORDER — PEG 3350-KCL-NA BICARB-NACL 420 G PO SOLR: 4000.0000 mL | Freq: Once | ORAL | 0 refills | Status: AC

## 2023-10-22 NOTE — Addendum Note (Signed)
 Addended by: JEANELL GRAEME RAMAN on: 10/22/2023 10:41 AM   Modules accepted: Orders

## 2023-10-22 NOTE — Telephone Encounter (Signed)
 Questionnaire from recall, no referral needed

## 2023-10-29 ENCOUNTER — Other Ambulatory Visit (HOSPITAL_COMMUNITY)

## 2023-10-29 ENCOUNTER — Other Ambulatory Visit: Payer: Medicare Other

## 2023-10-31 ENCOUNTER — Ambulatory Visit (HOSPITAL_COMMUNITY)
Admission: RE | Admit: 2023-10-31 | Discharge: 2023-10-31 | Disposition: A | Discharge status: Home / Self Care | Source: Ambulatory Visit | Attending: Obstetrics and Gynecology | Admitting: Obstetrics and Gynecology

## 2023-10-31 DIAGNOSIS — Z780 Asymptomatic menopausal state: Secondary | ICD-10-CM | POA: Diagnosis present

## 2023-11-05 ENCOUNTER — Ambulatory Visit: Payer: Self-pay | Admitting: Obstetrics and Gynecology

## 2023-11-13 ENCOUNTER — Encounter (HOSPITAL_COMMUNITY): Payer: Self-pay | Admitting: Gastroenterology

## 2023-11-13 ENCOUNTER — Encounter (HOSPITAL_COMMUNITY)
Admission: RE | Disposition: A | Discharge status: Home / Self Care | Payer: Self-pay | Source: Home / Self Care | Attending: Gastroenterology

## 2023-11-13 ENCOUNTER — Ambulatory Visit (HOSPITAL_COMMUNITY): Admitting: Certified Registered"

## 2023-11-13 ENCOUNTER — Ambulatory Visit (HOSPITAL_COMMUNITY)
Admission: RE | Admit: 2023-11-13 | Discharge: 2023-11-13 | Disposition: A | Discharge status: Home / Self Care | Attending: Gastroenterology | Admitting: Gastroenterology

## 2023-11-13 ENCOUNTER — Other Ambulatory Visit: Payer: Self-pay

## 2023-11-13 DIAGNOSIS — K633 Ulcer of intestine: Secondary | ICD-10-CM

## 2023-11-13 DIAGNOSIS — K529 Noninfective gastroenteritis and colitis, unspecified: Secondary | ICD-10-CM | POA: Diagnosis present

## 2023-11-13 DIAGNOSIS — G4730 Sleep apnea, unspecified: Secondary | ICD-10-CM | POA: Insufficient documentation

## 2023-11-13 DIAGNOSIS — I10 Essential (primary) hypertension: Secondary | ICD-10-CM | POA: Diagnosis not present

## 2023-11-13 DIAGNOSIS — K649 Unspecified hemorrhoids: Secondary | ICD-10-CM | POA: Diagnosis not present

## 2023-11-13 DIAGNOSIS — K219 Gastro-esophageal reflux disease without esophagitis: Secondary | ICD-10-CM | POA: Diagnosis not present

## 2023-11-13 DIAGNOSIS — G43909 Migraine, unspecified, not intractable, without status migrainosus: Secondary | ICD-10-CM | POA: Diagnosis not present

## 2023-11-13 DIAGNOSIS — E119 Type 2 diabetes mellitus without complications: Secondary | ICD-10-CM | POA: Diagnosis not present

## 2023-11-13 DIAGNOSIS — K648 Other hemorrhoids: Secondary | ICD-10-CM | POA: Diagnosis not present

## 2023-11-13 DIAGNOSIS — Z1211 Encounter for screening for malignant neoplasm of colon: Secondary | ICD-10-CM

## 2023-11-13 HISTORY — PX: COLONOSCOPY: SHX5424

## 2023-11-13 LAB — HM COLONOSCOPY

## 2023-11-13 SURGERY — COLONOSCOPY
Anesthesia: General

## 2023-11-13 MED ORDER — PHENYLEPHRINE 80 MCG/ML (10ML) SYRINGE FOR IV PUSH (FOR BLOOD PRESSURE SUPPORT)
PREFILLED_SYRINGE | INTRAVENOUS | Status: DC | PRN
  Administered 2023-11-13: 160 ug via INTRAVENOUS

## 2023-11-13 MED ORDER — LACTATED RINGERS IV SOLN: INTRAVENOUS | Status: DC

## 2023-11-13 MED ORDER — LACTATED RINGERS IV SOLN: INTRAVENOUS | Status: DC | PRN

## 2023-11-13 MED ORDER — PROPOFOL 10 MG/ML IV BOLUS
INTRAVENOUS | Status: DC | PRN
  Administered 2023-11-13: 125 ug/kg/min via INTRAVENOUS
  Administered 2023-11-13: 80 mg via INTRAVENOUS

## 2023-11-13 MED ORDER — LIDOCAINE 2% (20 MG/ML) 5 ML SYRINGE
INTRAMUSCULAR | Status: DC | PRN
  Administered 2023-11-13: 80 mg via INTRAVENOUS

## 2023-11-13 NOTE — Transfer of Care (Addendum)
 Immediate Anesthesia Transfer of Care Note  Patient: Christina Church  Procedure(s) Performed: COLONOSCOPY  Patient Location: Endoscopy Unit  Anesthesia Type:General  Level of Consciousness: drowsy and patient cooperative  Airway & Oxygen Therapy: Patient Spontanous Breathing  Post-op Assessment: Report given to RN and Post -op Vital signs reviewed and stable  Post vital signs: Reviewed and stable  Last Vitals:  Vitals Value Taken Time  BP 99/38 11/13/23   1349  Temp 37 11/13/23   1349  Pulse 57 11/13/23   1349  Resp 16 11/13/23   1349  SpO2 97% 11/13/23   1349    Last Pain:  Vitals:   11/13/23 1305  TempSrc:   PainSc: 0-No pain      Patients Stated Pain Goal: 9 (11/13/23 1210)  Complications: Subsequent blood pressure  on 11/13/23 at 1340 98/49.

## 2023-11-13 NOTE — Anesthesia Preprocedure Evaluation (Addendum)
 Anesthesia Evaluation  Patient identified by MRN, date of birth, ID band Patient awake    Reviewed: Allergy & Precautions, H&P , NPO status , Patient's Chart, lab work & pertinent test results, reviewed documented beta blocker date and time   Airway Mallampati: II  TM Distance: >3 FB Neck ROM: full    Dental no notable dental hx.    Pulmonary neg pulmonary ROS   Pulmonary exam normal breath sounds clear to auscultation       Cardiovascular Exercise Tolerance: Good hypertension, negative cardio ROS  Rhythm:regular Rate:Normal     Neuro/Psych  Headaches PSYCHIATRIC DISORDERS         GI/Hepatic Neg liver ROS,GERD  ,,  Endo/Other  diabetes    Renal/GU negative Renal ROS  negative genitourinary   Musculoskeletal   Abdominal   Peds  Hematology negative hematology ROS (+)   Anesthesia Other Findings   Reproductive/Obstetrics negative OB ROS                              Anesthesia Physical Anesthesia Plan  ASA: 2  Anesthesia Plan: General   Post-op Pain Management:    Induction:   PONV Risk Score and Plan: Propofol  infusion  Airway Management Planned:   Additional Equipment:   Intra-op Plan:   Post-operative Plan:   Informed Consent: I have reviewed the patients History and Physical, chart, labs and discussed the procedure including the risks, benefits and alternatives for the proposed anesthesia with the patient or authorized representative who has indicated his/her understanding and acceptance.     Dental Advisory Given  Plan Discussed with: CRNA  Anesthesia Plan Comments:          Anesthesia Quick Evaluation

## 2023-11-13 NOTE — Anesthesia Procedure Notes (Signed)
 Date/Time: 11/13/2023 1:06 PM  Performed by: Para Jerelene CROME, CRNAOxygen Delivery Method: Nasal cannula

## 2023-11-13 NOTE — Discharge Instructions (Signed)
You are being discharged to home.  Resume your previous diet.  We are waiting for your pathology results.  Your physician has recommended a repeat colonoscopy in seven years for screening purposes.

## 2023-11-13 NOTE — H&P (Signed)
 Christina Church is an 68 y.o. female.   Chief Complaint: follow up colon erosions HPI: Christina Church is a 68 y.o. female with past medical history of DM, migraines, sleep apnea , coming for follow up colon erosions.  The patient denies having any nausea, vomiting, fever, chills, hematochezia, melena, hematemesis, abdominal distention, abdominal pain, diarrhea, jaundice, pruritus or weight loss.  Has taken ibuprofen a few times since his last colonoscopy but not frequently.  Past Medical History:  Diagnosis Date   Diabetes (HCC)    Elevated hemoglobin A1c 07/07/2021   level 6.0.  see scanned report.   Migraines    Post-menopausal bleeding 01/2018    Past Surgical History:  Procedure Laterality Date   BIOPSY  09/14/2022   Procedure: BIOPSY;  Surgeon: Eartha Angelia Sieving, MD;  [facility];  Service: Gastroenterology;;   BIOPSY  11/02/2022   Procedure: BIOPSY;  Surgeon: Eartha Angelia, Sieving, MD;  [facility];  Service: Gastroenterology;;   CARPAL TUNNEL RELEASE     right and left hand   cataract surgery Bilateral    COLON BIOPSY     COLONOSCOPY WITH PROPOFOL  N/A 11/02/2022   Procedure: COLONOSCOPY WITH PROPOFOL ;  Surgeon: Eartha Angelia Sieving, MD;  [facility];  Service: Gastroenterology;  Laterality: N/A;  12:45PM;ASA 2   DILATATION & CURETTAGE/HYSTEROSCOPY WITH MYOSURE N/A 04/04/2018   Procedure: DILATATION & CURETTAGE/HYSTEROSCOPY WITH MYOSURE;  Surgeon: Rockney Evalene SQUIBB, MD;  Location: [facility];  Service: Gynecology;  Laterality: N/A;  request 7:30am OR time in [place] Block requests one hour OR time   ESOPHAGEAL DILATION N/A 09/14/2022   Procedure: ESOPHAGEAL DILATION;  Surgeon: Eartha Angelia Sieving, MD;  [facility];  Service: Gastroenterology;  Laterality: N/A;  1:00PM;ASA 2   ESOPHAGOGASTRODUODENOSCOPY (EGD) WITH PROPOFOL  N/A 09/14/2022   Procedure: ESOPHAGOGASTRODUODENOSCOPY  (EGD) WITH PROPOFOL ;  Surgeon: Eartha Angelia Sieving, MD;  [facility];  Service: Gastroenterology;  Laterality: N/A;  1:00PM;ASA 2   ESOPHAGOGASTRODUODENOSCOPY (EGD) WITH PROPOFOL  N/A 11/28/2022   Procedure: ESOPHAGOGASTRODUODENOSCOPY (EGD) WITH PROPOFOL ;  Surgeon: Eartha Angelia Sieving, MD;  [facility];  Service: Gastroenterology;  Laterality: N/A;  8:45AM;ASA 1   POLYPECTOMY  11/02/2022   Procedure: POLYPECTOMY;  Surgeon: Eartha Angelia Sieving, MD;  [facility];  Service: Gastroenterology;;   POLYPECTOMY  11/28/2022   Procedure: POLYPECTOMY;  Surgeon: Eartha Angelia, Sieving, MD;  [facility];  Service: Gastroenterology;;   TONSILLECTOMY     age 46    Family History  Problem Relation Age of Onset   Diabetes Mother    Osteoporosis Mother    Lung cancer Mother    Stroke Mother    Hypertension Father    Lung cancer Father    Stroke Father    Breast cancer Maternal Aunt 12   Cancer Maternal Aunt        BONE AND LUNG   Social History:  reports that she has never smoked. She has never been exposed to tobacco smoke. She has never used smokeless tobacco. She reports that she does not drink alcohol and does not use drugs.  Allergies:  Allergies  Allergen Reactions   Codeine-Guaifenesin Other (See Comments)    Disoriented   Meclizine  Other (See Comments)    Disoriented     Medications Prior to Admission  Medication Sig Dispense Refill   acetaminophen (TYLENOL) 500 MG tablet Take 500-1,000 mg by mouth every 8 (eight) hours as needed for  moderate pain.     Cholecalciferol (VITAMIN D3 GUMMIES) 25 MCG (1000 UT) CHEW Chew 2,000 Units by mouth daily. Infusion     levothyroxine (SYNTHROID) 50 MCG tablet Take 50 mcg by mouth daily before breakfast.     omeprazole  (PRILOSEC) 40 MG capsule Take 1 capsule (40 mg total) by mouth 2 (two) times daily. 120 capsule 0   PARoxetine  (PAXIL ) 20 MG tablet Take 1 tablet (20 mg total) by mouth  daily. 90 tablet 3   Povidone, PF, (IVIZIA DRY EYES) 0.5 % SOLN Place 1 drop into both eyes 2 (two) times daily.     pravastatin (PRAVACHOL) 40 MG tablet Take 40 mg by mouth daily.     SUMAtriptan (IMITREX) 50 MG tablet Take 50 mg by mouth every 2 (two) hours as needed for migraine. May repeat in 2 hours if headache persists or recurs.      No results found for this or any previous visit (from the past 48 hours). No results found.  Review of Systems  All other systems reviewed and are negative.   Blood pressure (!) 125/53, pulse (!) 52, temperature 98.4 F (36.9 C), temperature source Oral, resp. rate 13, height 5' 4 (1.626 m), weight 71.7 kg, last menstrual period 11/13/2007, SpO2 96%. Physical Exam  GENERAL: The patient is AO x3, in no acute distress. HEENT: Head is normocephalic and atraumatic. EOMI are intact. Mouth is well hydrated and without lesions. NECK: Supple. No masses LUNGS: Clear to auscultation. No presence of rhonchi/wheezing/rales. Adequate chest expansion HEART: RRR, normal s1 and s2. ABDOMEN: Soft, nontender, no guarding, no peritoneal signs, and nondistended. BS +. No masses. EXTREMITIES: Without any cyanosis, clubbing, rash, lesions or edema. NEUROLOGIC: AOx3, no focal motor deficit. SKIN: no jaundice, no rashes  Assessment/Plan [name] is a 68 y.o. female with past medical history of DM, migraines, sleep apnea , coming for follow up colon erosions.  Will proceed with colonoscopy.  Toribio Eartha Flavors, MD 11/13/2023, 12:29 PM

## 2023-11-13 NOTE — Op Note (Signed)
 [facility] Patient Name: Christina Church Procedure Date: 11/13/2023 12:57 PM MRN: [id number] Date of Birth: [birth year] Attending MD: Toribio Fortune , , [id number] CSN: [id number] Age: 68 Admit Type: Outpatient Procedure:                Colonoscopy Indications:              Follow-up of colitis Providers:                Toribio Fortune, Devere Lodge, Bascom Blush Referring MD:              Medicines:                Monitored Anesthesia Care Complications:            No immediate complications. Estimated Blood Loss:     Estimated blood loss: none. Procedure:                Pre-Anesthesia Assessment:                           - Prior to the procedure, a History and Physical                            was performed, and patient medications, allergies                            and sensitivities were reviewed. The patient's                            tolerance of previous anesthesia was reviewed.                           - The risks and benefits of the procedure and the                            sedation options and risks were discussed with the                            patient. All questions were answered and informed                            consent was obtained.                           - ASA Grade Assessment: II - A patient with mild                            systemic disease.                           After obtaining informed consent, the colonoscope                            was passed under direct vision. Throughout the                            procedure, the patient's blood pressure, pulse, and  oxygen saturations were monitored continuously. The                            [id number]-[id number] ([id number]) Peds Colon was introduced                            through the anus and advanced to the the terminal                            ileum. The colonoscopy was performed without                            difficulty. The patient tolerated the procedure                             well. The quality of the bowel preparation was                            excellent. Scope In: 1:09:56 PM Scope Out: 1:23:59 PM Scope Withdrawal Time: 0 hours 11 minutes 53 seconds  Total Procedure Duration: 0 hours 14 minutes 3 seconds  Findings:      The perianal and digital rectal examinations were normal.      The terminal ileum appeared normal. Biopsies were taken with a cold       forceps for histology.      A few localized non-bleeding erosions were found in the proximal       transverse colon and in the cecum. Biopsies from areas of erosions,       ascending colon, transverse, descending and rectosigmoid colon were       taken with a cold forceps for histology.      Non-bleeding internal hemorrhoids were found during retroflexion. The       hemorrhoids were small.      Query if erosions are medication related - due to sumatriptan. Impression:               - The examined portion of the ileum was normal.                            Biopsied.                           - A few erosions in the proximal transverse colon                            and in the cecum. Biopsied.                           - Non-bleeding internal hemorrhoids. Moderate Sedation:      Per Anesthesia Care Recommendation:           - Discharge patient to home (ambulatory).                           - Resume previous diet.                           -  Await pathology results.                           - Repeat colonoscopy in 7 years for screening                            purposes. Procedure Code(s):        --- Professional ---                           [phone], Colonoscopy, flexible; with biopsy, single                            or multiple Diagnosis Code(s):        --- Professional ---                           K64.8, Other hemorrhoids                           K63.3, Ulcer of intestine                           K52.9, Noninfective gastroenteritis and colitis,                             unspecified CPT copyright 2022 American Medical Association. All rights reserved. The codes documented in this report are preliminary and upon coder review may  be revised to meet current compliance requirements. Toribio Fortune, MD Toribio Fortune,  11/13/2023 1:34:00 PM This report has been signed electronically. Number of Addenda: 0

## 2023-11-14 ENCOUNTER — Encounter (HOSPITAL_COMMUNITY): Payer: Self-pay | Admitting: Gastroenterology

## 2023-11-14 LAB — SURGICAL PATHOLOGY

## 2023-11-14 NOTE — Anesthesia Postprocedure Evaluation (Signed)
 Anesthesia Post Note  Patient: Christina Church  Procedure(s) Performed: COLONOSCOPY  Patient location during evaluation: Phase II Anesthesia Type: General Level of consciousness: awake Pain management: pain level controlled Vital Signs Assessment: post-procedure vital signs reviewed and stable Respiratory status: spontaneous breathing and respiratory function stable Cardiovascular status: blood pressure returned to baseline and stable Postop Assessment: no headache and no apparent nausea or vomiting Anesthetic complications: no Comments: Late entry   No notable events documented.   Last Vitals:  Vitals:   11/13/23 1334 11/13/23 1340  BP: (!) 94/42 (!) 98/49  Pulse: (!) 57 (!) 55  Resp: 16 20  Temp:    SpO2: 98% 99%    Last Pain:  Vitals:   11/13/23 1328  TempSrc: Oral  PainSc: 0-No pain                 Yvonna JINNY Bosworth

## 2023-11-15 ENCOUNTER — Other Ambulatory Visit: Payer: Self-pay | Admitting: Gastroenterology

## 2023-11-15 ENCOUNTER — Ambulatory Visit: Payer: Self-pay | Admitting: Gastroenterology

## 2023-11-15 DIAGNOSIS — K5010 Crohn's disease of large intestine without complications: Secondary | ICD-10-CM | POA: Insufficient documentation

## 2023-11-15 NOTE — Progress Notes (Signed)
Entyvio order

## 2023-11-19 ENCOUNTER — Other Ambulatory Visit (INDEPENDENT_AMBULATORY_CARE_PROVIDER_SITE_OTHER): Payer: Self-pay

## 2023-11-19 ENCOUNTER — Telehealth: Payer: Self-pay

## 2023-11-19 DIAGNOSIS — R933 Abnormal findings on diagnostic imaging of other parts of digestive tract: Secondary | ICD-10-CM

## 2023-11-19 DIAGNOSIS — R194 Change in bowel habit: Secondary | ICD-10-CM

## 2023-11-19 DIAGNOSIS — R140 Abdominal distension (gaseous): Secondary | ICD-10-CM

## 2023-11-19 DIAGNOSIS — Z1159 Encounter for screening for other viral diseases: Secondary | ICD-10-CM

## 2023-11-19 DIAGNOSIS — K5010 Crohn's disease of large intestine without complications: Secondary | ICD-10-CM

## 2023-11-19 DIAGNOSIS — R1032 Left lower quadrant pain: Secondary | ICD-10-CM

## 2023-11-19 DIAGNOSIS — R1084 Generalized abdominal pain: Secondary | ICD-10-CM

## 2023-11-19 DIAGNOSIS — Z111 Encounter for screening for respiratory tuberculosis: Secondary | ICD-10-CM

## 2023-11-19 NOTE — Telephone Encounter (Signed)
 Noted. Thanks.

## 2023-11-19 NOTE — Telephone Encounter (Signed)
 Thanks

## 2023-11-19 NOTE — Telephone Encounter (Signed)
 Hello,  Patient will be scheduled as soon as possible.  Auth Submission: NO AUTH NEEDED Site of care: [facility] of care: [facility] Payer: medicare a/b, [employer] supp Medication & CPT/J Code(s) submitted: Entyvio (Vedolizumab) [id number] Diagnosis Code:  Route of submission (phone, fax, portal): portal Phone # Fax # Auth type: Buy/Bill HB Units/visits requested: 300mg  initial doses, then q8weeks Reference number:  Approval from: 11/19/23 to 03/18/24

## 2023-11-21 ENCOUNTER — Encounter (INDEPENDENT_AMBULATORY_CARE_PROVIDER_SITE_OTHER): Payer: Self-pay | Admitting: *Deleted

## 2023-11-22 LAB — HEPATITIS B SURFACE ANTIGEN: Hepatitis B Surface Ag: NONREACTIVE

## 2023-11-22 LAB — CBC WITH DIFFERENTIAL/PLATELET
Absolute Lymphocytes: 1818 {cells}/uL (ref 850–3900)
Absolute Monocytes: 239 {cells}/uL (ref 200–950)
Basophils Absolute: 32 {cells}/uL (ref 0–200)
Basophils Relative: 0.7 %
Eosinophils Absolute: 90 {cells}/uL (ref 15–500)
Eosinophils Relative: 2 %
HCT: 43.7 % (ref 35.0–45.0)
Hemoglobin: 14.2 g/dL (ref 11.7–15.5)
MCH: 30 pg (ref 27.0–33.0)
MCHC: 32.5 g/dL (ref 32.0–36.0)
MCV: 92.2 fL (ref 80.0–100.0)
MPV: 12.3 fL (ref 7.5–12.5)
Monocytes Relative: 5.3 %
Neutro Abs: 2322 {cells}/uL (ref 1500–7800)
Neutrophils Relative %: 51.6 %
Platelets: 119 Thousand/uL — ABNORMAL LOW (ref 140–400)
RBC: 4.74 Million/uL (ref 3.80–5.10)
RDW: 14.1 % (ref 11.0–15.0)
Total Lymphocyte: 40.4 %
WBC: 4.5 Thousand/uL (ref 3.8–10.8)

## 2023-11-22 LAB — COMPREHENSIVE METABOLIC PANEL WITH GFR
AG Ratio: 1.7 (calc) (ref 1.0–2.5)
ALT: 24 U/L (ref 6–29)
AST: 27 U/L (ref 10–35)
Albumin: 4.8 g/dL (ref 3.6–5.1)
Alkaline phosphatase (APISO): 75 U/L (ref 37–153)
BUN/Creatinine Ratio: 41 (calc) — ABNORMAL HIGH (ref 6–22)
BUN: 26 mg/dL — ABNORMAL HIGH (ref 7–25)
CO2: 30 mmol/L (ref 20–32)
Calcium: 9.7 mg/dL (ref 8.6–10.4)
Chloride: 103 mmol/L (ref 98–110)
Creat: 0.63 mg/dL (ref 0.50–1.05)
Globulin: 2.8 g/dL (ref 1.9–3.7)
Glucose, Bld: 91 mg/dL (ref 65–99)
Potassium: 4.3 mmol/L (ref 3.5–5.3)
Sodium: 140 mmol/L (ref 135–146)
Total Bilirubin: 0.6 mg/dL (ref 0.2–1.2)
Total Protein: 7.6 g/dL (ref 6.1–8.1)
eGFR: 98 mL/min/1.73m2 (ref >=60)

## 2023-11-22 LAB — QUANTIFERON-TB GOLD PLUS
Mitogen-NIL: 7.37 [IU]/mL
NIL: 0.01 [IU]/mL
QuantiFERON-TB Gold Plus: NEGATIVE
TB1-NIL: 0 [IU]/mL
TB2-NIL: 0 [IU]/mL

## 2023-11-22 LAB — C-REACTIVE PROTEIN: CRP: 3.1 mg/L (ref <8.0)

## 2023-11-25 ENCOUNTER — Ambulatory Visit: Payer: Self-pay | Admitting: Gastroenterology

## 2023-11-25 DIAGNOSIS — R5383 Other fatigue: Secondary | ICD-10-CM

## 2023-11-25 DIAGNOSIS — R933 Abnormal findings on diagnostic imaging of other parts of digestive tract: Secondary | ICD-10-CM

## 2023-11-25 DIAGNOSIS — R1032 Left lower quadrant pain: Secondary | ICD-10-CM

## 2023-11-25 DIAGNOSIS — K5010 Crohn's disease of large intestine without complications: Secondary | ICD-10-CM

## 2023-11-25 DIAGNOSIS — K317 Polyp of stomach and duodenum: Secondary | ICD-10-CM

## 2023-11-25 DIAGNOSIS — R1084 Generalized abdominal pain: Secondary | ICD-10-CM

## 2023-11-26 ENCOUNTER — Encounter: Payer: Self-pay | Admitting: Gastroenterology

## 2023-11-27 ENCOUNTER — Encounter: Attending: Gastroenterology | Admitting: Internal Medicine

## 2023-11-27 VITALS — BP 124/69 | HR 57 | Temp 98.1°F | Resp 16

## 2023-11-27 DIAGNOSIS — R1084 Generalized abdominal pain: Secondary | ICD-10-CM | POA: Insufficient documentation

## 2023-11-27 DIAGNOSIS — K317 Polyp of stomach and duodenum: Secondary | ICD-10-CM | POA: Insufficient documentation

## 2023-11-27 DIAGNOSIS — R1032 Left lower quadrant pain: Secondary | ICD-10-CM | POA: Diagnosis not present

## 2023-11-27 DIAGNOSIS — K5010 Crohn's disease of large intestine without complications: Secondary | ICD-10-CM | POA: Diagnosis present

## 2023-11-27 DIAGNOSIS — R5383 Other fatigue: Secondary | ICD-10-CM | POA: Diagnosis not present

## 2023-11-27 DIAGNOSIS — R933 Abnormal findings on diagnostic imaging of other parts of digestive tract: Secondary | ICD-10-CM | POA: Insufficient documentation

## 2023-11-27 MED ORDER — VEDOLIZUMAB 300 MG IV SOLR
300.0000 mg | Freq: Once | INTRAVENOUS | Status: AC
  Administered 2023-11-27: 300 mg via INTRAVENOUS
  Filled 2023-11-27: qty 5

## 2023-11-27 NOTE — Progress Notes (Signed)
 Diagnosis: Crohn's Disease  Provider:  Eartha Sieving MD  Procedure: IV Infusion  IV Type: Peripheral, IV Location: L Antecubital  Entyvio  (Vedolizumab ), Dose: 300 mg  Infusion Start Time: 1359  Infusion Stop Time: 1433  Post Infusion IV Care: Observation period completed  Discharge: Condition: Good, Destination: Home . AVS Provided  Performed by:  Blanca Selinda SAUNDERS, LPN

## 2023-12-05 ENCOUNTER — Other Ambulatory Visit (INDEPENDENT_AMBULATORY_CARE_PROVIDER_SITE_OTHER): Payer: Self-pay

## 2023-12-05 DIAGNOSIS — R1310 Dysphagia, unspecified: Secondary | ICD-10-CM

## 2023-12-05 DIAGNOSIS — K2100 Gastro-esophageal reflux disease with esophagitis, without bleeding: Secondary | ICD-10-CM

## 2023-12-05 MED ORDER — OMEPRAZOLE 40 MG PO CPDR: 40.0000 mg | DELAYED_RELEASE_CAPSULE | Freq: Every day | ORAL | 3 refills | Status: AC

## 2023-12-11 ENCOUNTER — Encounter: Payer: Self-pay | Admitting: Gastroenterology

## 2023-12-11 ENCOUNTER — Encounter: Admitting: Emergency Medicine

## 2023-12-11 VITALS — BP 131/62 | HR 51 | Temp 98.1°F | Resp 15

## 2023-12-11 DIAGNOSIS — K5010 Crohn's disease of large intestine without complications: Secondary | ICD-10-CM | POA: Diagnosis not present

## 2023-12-11 MED ORDER — VEDOLIZUMAB 300 MG IV SOLR
300.0000 mg | Freq: Once | INTRAVENOUS | Status: AC
  Administered 2023-12-11: 300 mg via INTRAVENOUS
  Filled 2023-12-11: qty 5

## 2023-12-11 NOTE — Progress Notes (Signed)
 Diagnosis: Crohn's Disease  Provider:  Eartha Sieving MD  Procedure: IV Infusion  IV Type: Peripheral, IV Location: L Antecubital  Entyvio  (Vedolizumab ), Dose: 300 mg  Infusion Start Time: 0936  Infusion Stop Time: 1011  Post Infusion IV Care: Peripheral IV Discontinued  Discharge: Condition: Good, Destination: Home . AVS Provided  Performed by:  Delon ONEIDA Officer, RN

## 2023-12-25 LAB — CBC WITH DIFFERENTIAL/PLATELET
Absolute Lymphocytes: 1805 {cells}/uL (ref 850–3900)
Absolute Monocytes: 383 {cells}/uL (ref 200–950)
Basophils Absolute: 20 {cells}/uL (ref 0–200)
Basophils Relative: 0.4 %
Eosinophils Absolute: 112 {cells}/uL (ref 15–500)
Eosinophils Relative: 2.2 %
HCT: 41.8 % (ref 35.0–45.0)
Hemoglobin: 13.8 g/dL (ref 11.7–15.5)
MCH: 29.5 pg (ref 27.0–33.0)
MCHC: 33 g/dL (ref 32.0–36.0)
MCV: 89.3 fL (ref 80.0–100.0)
MPV: 12 fL (ref 7.5–12.5)
Monocytes Relative: 7.5 %
Neutro Abs: 2780 {cells}/uL (ref 1500–7800)
Neutrophils Relative %: 54.5 %
Platelets: 136 Thousand/uL — ABNORMAL LOW (ref 140–400)
RBC: 4.68 Million/uL (ref 3.80–5.10)
RDW: 15.1 % — ABNORMAL HIGH (ref 11.0–15.0)
Total Lymphocyte: 35.4 %
WBC: 5.1 Thousand/uL (ref 3.8–10.8)

## 2023-12-25 LAB — COMPREHENSIVE METABOLIC PANEL WITH GFR
AG Ratio: 1.8 (calc) (ref 1.0–2.5)
ALT: 18 U/L (ref 6–29)
AST: 23 U/L (ref 10–35)
Albumin: 4.6 g/dL (ref 3.6–5.1)
Alkaline phosphatase (APISO): 76 U/L (ref 37–153)
BUN: 20 mg/dL (ref 7–25)
CO2: 29 mmol/L (ref 20–32)
Calcium: 9.8 mg/dL (ref 8.6–10.4)
Chloride: 103 mmol/L (ref 98–110)
Creat: 0.7 mg/dL (ref 0.50–1.05)
Globulin: 2.5 g/dL (ref 1.9–3.7)
Glucose, Bld: 94 mg/dL (ref 65–139)
Potassium: 4.1 mmol/L (ref 3.5–5.3)
Sodium: 139 mmol/L (ref 135–146)
Total Bilirubin: 0.5 mg/dL (ref 0.2–1.2)
Total Protein: 7.1 g/dL (ref 6.1–8.1)
eGFR: 95 mL/min/1.73m2 (ref >=60)

## 2023-12-26 ENCOUNTER — Telehealth (INDEPENDENT_AMBULATORY_CARE_PROVIDER_SITE_OTHER): Payer: Self-pay

## 2023-12-26 NOTE — Telephone Encounter (Signed)
 It appears her PCP was already following this.  She should reach her PCP and discuss the next steps in the evaluation of her low platelets.  There is no contraindication to taking Entyvio .  She had low platelets prior to starting this medication and these have not worsened, so she can receive the next dose of Entyvio .

## 2023-12-26 NOTE — Telephone Encounter (Signed)
 Patient called back regarding the message below regarding her Platelets being abnormal and wants to know if there is something she needs to do for this?   Also patient wants to know if she should still have her Entyvio  infusion on January 08, 2024?   Please let the patient know the CBC and CMP were stable. Platelets are still not normal but better compared to prior.

## 2023-12-27 NOTE — Telephone Encounter (Signed)
 I spoke with the patient this morning and made her aware per Dr. Eartha,  It appears her PCP was already following this.  She should reach her PCP and discuss the next steps in the evaluation of her low platelets.   There is no contraindication to taking Entyvio .  She had low platelets prior to starting this medication and these have not worsened, so she can receive the next dose of Entyvio .      Patient states understanding.

## 2024-01-01 ENCOUNTER — Encounter (INDEPENDENT_AMBULATORY_CARE_PROVIDER_SITE_OTHER): Payer: Self-pay | Admitting: Gastroenterology

## 2024-01-01 ENCOUNTER — Encounter: Payer: Self-pay | Admitting: Gastroenterology

## 2024-01-08 ENCOUNTER — Encounter: Attending: Gastroenterology | Admitting: Emergency Medicine

## 2024-01-08 VITALS — BP 105/66 | HR 63 | Temp 97.9°F | Resp 15

## 2024-01-08 DIAGNOSIS — K5010 Crohn's disease of large intestine without complications: Secondary | ICD-10-CM | POA: Insufficient documentation

## 2024-01-08 MED ORDER — SODIUM CHLORIDE 0.9 % IV SOLN: INTRAVENOUS | Status: DC

## 2024-01-08 MED ORDER — VEDOLIZUMAB 300 MG IV SOLR
300.0000 mg | Freq: Once | INTRAVENOUS | Status: AC
  Administered 2024-01-08: 300 mg via INTRAVENOUS
  Filled 2024-01-08: qty 5

## 2024-01-08 NOTE — Progress Notes (Signed)
 Diagnosis: Crohn's Disease  Provider:  Eartha Sieving MD  Procedure: IV Infusion  IV Type: Peripheral, IV Location: L Antecubital  Entyvio  (Vedolizumab ), Dose: 300 mg  Infusion Start Time: 0927  Infusion Stop Time: 1002  Post Infusion IV Care: Peripheral IV Discontinued  Discharge: Condition: Good, Destination: Home . AVS Provided  Performed by:  Delon ONEIDA Officer, RN

## 2024-01-15 ENCOUNTER — Ambulatory Visit: Admitting: Neurology

## 2024-02-05 ENCOUNTER — Other Ambulatory Visit: Payer: Self-pay | Admitting: Internal Medicine

## 2024-02-05 ENCOUNTER — Ambulatory Visit

## 2024-02-05 DIAGNOSIS — Z1231 Encounter for screening mammogram for malignant neoplasm of breast: Secondary | ICD-10-CM

## 2024-02-10 ENCOUNTER — Encounter: Payer: Self-pay | Admitting: Gastroenterology

## 2024-02-19 ENCOUNTER — Encounter: Payer: Self-pay | Admitting: Gastroenterology

## 2024-02-20 ENCOUNTER — Ambulatory Visit (INDEPENDENT_AMBULATORY_CARE_PROVIDER_SITE_OTHER): Admitting: Gastroenterology

## 2024-02-20 ENCOUNTER — Encounter (INDEPENDENT_AMBULATORY_CARE_PROVIDER_SITE_OTHER): Payer: Self-pay | Admitting: Gastroenterology

## 2024-02-20 VITALS — BP 117/70 | HR 69 | Temp 97.2°F | Ht 64.0 in | Wt 150.7 lb

## 2024-02-20 DIAGNOSIS — Z1321 Encounter for screening for nutritional disorder: Secondary | ICD-10-CM | POA: Insufficient documentation

## 2024-02-20 DIAGNOSIS — K5010 Crohn's disease of large intestine without complications: Secondary | ICD-10-CM | POA: Diagnosis not present

## 2024-02-20 DIAGNOSIS — L659 Nonscarring hair loss, unspecified: Secondary | ICD-10-CM

## 2024-02-20 NOTE — Patient Instructions (Signed)
-  continue with entyvio  every 8 weeks  -check vitamin D , Iron, B12, folate  -avoid trigger foods -avoid NSAIDs -consider nutritionist referral, let me know if you want to meet with them   Follow up 3 months  It was a pleasure to see you today. I want to create trusting relationships with patients and provide genuine, compassionate, and quality care. I truly value your feedback! please be on the lookout for a survey regarding your visit with me today. I appreciate your input about our visit and your time in completing this!    Seven Dollens L. Giancarlos Berendt, MSN, APRN, AGNP-C Adult-Gerontology Nurse Practitioner [facility]

## 2024-02-20 NOTE — Progress Notes (Addendum)
 Referring Provider: Shona Norleen PEDLAR, MD Primary Care Physician:  Shona Norleen PEDLAR, MD Primary GI Physician: Dr. Eartha   Chief Complaint  Patient presents with   Follow-up    Pt arrives for follow up. Pt states things are going ok but is having trouble figuring out what to eat. Does avoid spicy foods. No abdominal pain. Pt states she has noticed her hair falling out.    HPI:   Christina Church is a 68 y.o. female with past medical history of DM, migraines, sleep apnea    Patient presenting today for:  Follow up of moderate Crohn's disease   Last seen April, at that time having a lot of gas and bloating, 1 formed stool per day, sometimes looser, some abdominal discomfort. Taking an otc gas pill, ibuprofen a few times per month, taking omeprazole  40mg  daily, rare breakthrough.   Recommended repeat TCS in August, avoid nsaids, continue omeprazole  40mg  daily, celiac panel, SIMO/SIBO testing if celiac negative, ibgard, daily probiotic  Celiac panel negative April 2025  Colonoscopy 11/13/23 - The examined portion of the ileum was normal.                            Biopsied.                           - A few erosions in the proximal transverse colon                            and in the cecum. Biopsied.                           - Non-bleeding internal hemorrhoids. A. TERMINAL ILEUM BIOPSY:  Ileal mucosa with benign lymphoid aggregate.  No active inflammation, chronic changes or granulomas.   B. CECAL BIOPSY:  Colonic mucosa with focal active inflammation and erosion.  Mild chronic changes suggestive of inflammatory bowel disease.  Negative for dysplasia.   C. COLON ASCENDING BIOPSY:  Unremarkable colonic mucosa.  Negative for active inflammation, chronic changes and granulomas.   Repeat in 7 years  Cbc and cmp in September with plt count 119k BUN 26 crp 3.1  Negative TB and Hep B testing on 11/20/23  TSH in June 2025 was 2.5   Patient recommended to start entyvio  for moderate  Crohn's disease  1st infusion 11/27/23 Second infusion 12/11/23  3rd infusion 01/08/24  Present:  Doing okay on entyvio . She did have one episode of constipation shortly after starting. She also had some diarrhea about 3 days ago with 2 watery stools but this has resolved. She wonders if this was related to something she ate. On average having one BM per day that is soft but formed.  She has lost some weight due to eating less and being very active recently. She stays very busy doing things with her family. She is trying to figure out what to eat, she knows spicy foods do not agree with her. No abdominal pain, rectal bleeding or melena. She does endorse more hair loss for about the past 3 weeks, she state she has always shed a lot of hair so unsure if this is much worse than previous.   Extraintestinal Manifestations: Skin: no skin lesions rashes  Joints: no joint swelling, has chronic neck pain  Eyes: no vision changes  Flu  shot: getting on at upcoming PCP appt  Covid shot: none  Shingles vaccine: she has had full series Pna vaccine: unsure if she has had this  Mammogram: has one scheduled on 03/04/24 Pap smear: 02/2023, due in 02/2025   SLP swallow eval: 10/08/22 Pt's oropharyngeal swallowing presents to be within functional limits today on MBSS. Oral coordination of swallowing and timing of swallow is WFL, good laryngeal vestibule closure, good pharyngeal stripping wave and laryngeal excursion. Note occasional trace pharyngeal residue cleared with a reflexive repeat swallow. Brief stasis of the barium tablet noted in the proximal esophagus that was cleared with one additional puree bolus presentation, radiologist present to confirm. SLP reviewed universal aspiration precautions with the Pt. Recommend continue with regular diet and thin liquids. There are no further ST needs noted at this time,  Last Colonoscopy: 10/2022 - Hemorrhoids found on perianal exam.                           - The  examined portion of the ileum was normal.                           - A few erosions in the cecum. Biopsied.                           - One 4 mm polyp in the ascending colon, removed                            with a cold snare. Resected and retrieved.                           - Non-bleeding internal hemorrhoids 1 tubular adenoma.  Cecal biopsies and part of the ascending colon polypectomy site showed focal active colitis and very mild chronic changes.  I explained to the patient that this changes could be related to her NSAIDs but we will need to monitor to make sure this is not smoldering Crohn's.   Repeat colonoscopy in 1 year for surveillance and performing biopsies. Last Endoscopy:11/2022:3 cm hiatal hernia.                           - Two gastric polyps. Resected and retrieved- Hyperplastic, no repeat EGD recommended - Normal examined duodenum. (Initial EGD concerning for BE but biopsy not consistent with this)  Past Medical History:  Diagnosis Date   Diabetes (HCC)    Elevated hemoglobin A1c 07/07/2021   level 6.0.  see scanned report.   Migraines    Post-menopausal bleeding 01/2018    Past Surgical History:  Procedure Laterality Date   BIOPSY  09/14/2022   Procedure: BIOPSY;  Surgeon: Eartha Angelia Sieving, MD;  [facility];  Service: Gastroenterology;;   BIOPSY  11/02/2022   Procedure: BIOPSY;  Surgeon: Eartha Angelia Sieving, MD;  [facility];  Service: Gastroenterology;;   CARPAL TUNNEL RELEASE     right and left hand   cataract surgery Bilateral    COLON BIOPSY     COLONOSCOPY N/A 11/13/2023   Procedure: COLONOSCOPY;  Surgeon: Eartha Angelia Sieving, MD;  [facility];  Service: Gastroenterology;  Laterality: N/A;  215pm, asa 1-2   COLONOSCOPY WITH PROPOFOL  N/A 11/02/2022   Procedure: COLONOSCOPY WITH  PROPOFOL ;  Surgeon: Eartha Flavors, Toribio, MD;  [facility];  Service: Gastroenterology;  Laterality: N/A;   12:45PM;ASA 2   DILATATION & CURETTAGE/HYSTEROSCOPY WITH MYOSURE N/A 04/04/2018   Procedure: DILATATION & CURETTAGE/HYSTEROSCOPY WITH MYOSURE;  Surgeon: Rockney Evalene SQUIBB, MD;  Location: [facility];  Service: Gynecology;  Laterality: N/A;  request 7:30am OR time in [place] requests one hour OR time   ESOPHAGEAL DILATION N/A 09/14/2022   Procedure: ESOPHAGEAL DILATION;  Surgeon: Eartha Flavors Toribio, MD;  [facility];  Service: Gastroenterology;  Laterality: N/A;  1:00PM;ASA 2   ESOPHAGOGASTRODUODENOSCOPY (EGD) WITH PROPOFOL  N/A 09/14/2022   Procedure: ESOPHAGOGASTRODUODENOSCOPY (EGD) WITH PROPOFOL ;  Surgeon: Eartha Flavors Toribio, MD;  [facility];  Service: Gastroenterology;  Laterality: N/A;  1:00PM;ASA 2   ESOPHAGOGASTRODUODENOSCOPY (EGD) WITH PROPOFOL  N/A 11/28/2022   Procedure: ESOPHAGOGASTRODUODENOSCOPY (EGD) WITH PROPOFOL ;  Surgeon: Eartha Flavors Toribio, MD;  [facility];  Service: Gastroenterology;  Laterality: N/A;  8:45AM;ASA 1   POLYPECTOMY  11/02/2022   Procedure: POLYPECTOMY;  Surgeon: Eartha Flavors Toribio, MD;  [facility];  Service: Gastroenterology;;   POLYPECTOMY  11/28/2022   Procedure: POLYPECTOMY;  Surgeon: Eartha Flavors, Toribio, MD;  [facility];  Service: Gastroenterology;;   TONSILLECTOMY     age 36    Current Outpatient Medications  Medication Sig Dispense Refill   Probiotic Product (PROBIOTIC PO) Take by mouth.     acetaminophen (TYLENOL) 500 MG tablet Take 500-1,000 mg by mouth every 8 (eight) hours as needed for moderate pain.     Cholecalciferol (VITAMIN D3 GUMMIES) 25 MCG (1000 UT) CHEW Chew 2,000 Units by mouth daily. Infusion     levothyroxine (SYNTHROID) 50 MCG tablet Take 50 mcg by mouth daily before breakfast.     omeprazole  (PRILOSEC) 40 MG capsule Take 1 capsule (40 mg total) by mouth daily. 90 capsule 3   PARoxetine  (PAXIL ) 20 MG tablet Take 1  tablet (20 mg total) by mouth daily. 90 tablet 3   Povidone, PF, (IVIZIA DRY EYES) 0.5 % SOLN Place 1 drop into both eyes 2 (two) times daily.     pravastatin (PRAVACHOL) 40 MG tablet Take 40 mg by mouth daily.     SUMAtriptan (IMITREX) 50 MG tablet Take 50 mg by mouth every 2 (two) hours as needed for migraine. May repeat in 2 hours if headache persists or recurs.     No current facility-administered medications for this visit.    Allergies as of 02/20/2024 - Review Complete 02/20/2024  Allergen Reaction Noted   Codeine-guaifenesin Other (See Comments) 10/13/2010   Meclizine  Other (See Comments) 01/03/2018    Social History   Socioeconomic History   Marital status: Married    Spouse name: Not on file   Number of children: Not on file   Years of education: Not on file   Highest education level: Not on file  Occupational History   Not on file  Tobacco Use   Smoking status: Never    Passive exposure: Never   Smokeless tobacco: Never  Vaping Use   Vaping status: Never Used  Substance and Sexual Activity   Alcohol use: No    Alcohol/week: 0.0 standard drinks of alcohol   Drug use: No   Sexual activity: Not Currently    Comment: 1st intercourse 68 yo-Fewer than 5 partners, no STD, no abnormal pap  Other Topics Concern   Not on file  Social History Narrative   Not on  file   Social Drivers of Corporate Investment Banker Strain: Not on file  Food Insecurity: Not on file  Transportation Needs: Not on file  Physical Activity: Not on file  Stress: Not on file  Social Connections: Not on file    Review of systems General: negative for malaise, night sweats, fever, chills, weight loss Neck: Negative for lumps, goiter, pain and significant neck swelling Resp: Negative for cough, wheezing, dyspnea at rest CV: Negative for chest pain, leg swelling, palpitations, orthopnea GI: denies melena, hematochezia, nausea, vomiting, diarrhea, constipation, dysphagia, odyonophagia, early  satiety or unintentional weight loss.  MSK: Negative for joint pain or swelling, back pain, and muscle pain. Derm: Negative for itching or rash +hair loss  Psych: Denies depression, anxiety, memory loss, confusion. No homicidal or suicidal ideation.  Heme: Negative for prolonged bleeding, bruising easily, and swollen nodes. Endocrine: Negative for cold or heat intolerance, polyuria, polydipsia and goiter. Neuro: negative for tremor, gait imbalance, syncope and seizures. The remainder of the review of systems is noncontributory.  Physical Exam: LMP 11/13/2007  General:   Alert and oriented. No distress noted. Pleasant and cooperative.  Head:  Normocephalic and atraumatic. Eyes:  Conjuctiva clear without scleral icterus. Mouth:  Oral mucosa pink and moist. Good dentition. No lesions. Heart: Normal rate and rhythm, s1 and s2 heart sounds present.  Lungs: Clear lung sounds in all lobes. Respirations equal and unlabored. Abdomen:  +BS, soft, non-tender and non-distended. No rebound or guarding. No HSM or masses noted. Derm: No palmar erythema or jaundice Msk:  Symmetrical without gross deformities. Normal posture. Extremities:  Without edema. Neurologic:  Alert and  oriented x4 Psych:  Alert and cooperative. Normal mood and affect.  Invalid input(s): 6 MONTHS   ASSESSMENT: Clifton Kovacic is a 68 y.o. female presenting today for follow up of crohn's disease   Recent colonoscopy as above with pathology findings concerning for moderate crohn's disease. She was started on entyvio  in September and has first upcoming maintenance dose later this month. Seems to be doing well on this overall. Bowel movements seem to be regular with soft but solid stools, no abdominal pain or rectal bleeding. She is up to date on health maintenance/screenings/labs. She does endorse some hair loss recently, low suspicion this is related to entyvio  is this is not a typical side effect of the medication. I would  be more concerned about possible vitamin/nutrient deficiencies contributing to this in setting of Crohn's disease and possible malabsorption of these. TSH a few months ago was WNL. We will check Vitamin D , iron, B12 and Folate. May need to consider other testing such as zinc, vitamin E if the above is normal and she continues to have hair loss. We did discuss nutritionist referral given she Is having trouble figuring out what foods she can tolerate, at this time she wants to hold off as she does not feel she has time to meet with them, I encouraged her to let me know if she changes her mind.    PLAN:  -continue with entyvio  Q8w -check vitamin D , Iron, B12, folate  -consider zinc, vitamin E levels as well if hair loss persists and the above testing is normal  -avoid trigger foods -avoid NSAIDs -inflammatory markers at follow up -consider nutritionist referral   All questions were answered, patient verbalized understanding and is in agreement with plan as outlined above.   Follow Up: 3 months   Gen Clagg L. Pernella Ackerley, MSN, APRN, AGNP-C Adult-Gerontology Nurse Practitioner [facility]  I have reviewed the note and agree with the APP's assessment as described in this progress note  Toribio Fortune, MD Gastroenterology and Hepatology [facility]

## 2024-02-21 LAB — B12 AND FOLATE PANEL
Folate: 18.2 ng/mL
Vitamin B-12: 134 pg/mL — ABNORMAL LOW (ref 200–1100)

## 2024-02-21 LAB — IRON,TIBC AND FERRITIN PANEL
%SAT: 8 % — ABNORMAL LOW (ref 16–45)
Ferritin: 14 ng/mL — ABNORMAL LOW (ref 16–288)
Iron: 33 ug/dL — ABNORMAL LOW (ref 45–288)
TIBC: 431 ug/dL (ref 250–450)

## 2024-02-21 LAB — VITAMIN D 25 HYDROXY (VIT D DEFICIENCY, FRACTURES): Vit D, 25-Hydroxy: 42 ng/mL (ref 30–100)

## 2024-02-24 ENCOUNTER — Ambulatory Visit (INDEPENDENT_AMBULATORY_CARE_PROVIDER_SITE_OTHER): Payer: Self-pay | Admitting: Gastroenterology

## 2024-02-24 MED ORDER — FERROUS SULFATE 325 (65 FE) MG PO TABS: 325.0000 mg | ORAL_TABLET | Freq: Two times a day (BID) | ORAL | 3 refills | Status: AC

## 2024-02-24 NOTE — Progress Notes (Signed)
Lab report faxed to PCP ? ?

## 2024-03-04 ENCOUNTER — Inpatient Hospital Stay: Admission: RE | Admit: 2024-03-04 | Discharge: 2024-03-04 | Attending: Internal Medicine | Admitting: Internal Medicine

## 2024-03-04 ENCOUNTER — Encounter: Payer: Self-pay | Admitting: Gastroenterology

## 2024-03-04 ENCOUNTER — Telehealth: Payer: Self-pay

## 2024-03-04 ENCOUNTER — Encounter: Attending: Gastroenterology | Admitting: Emergency Medicine

## 2024-03-04 VITALS — BP 125/67 | HR 55 | Temp 98.0°F | Resp 17

## 2024-03-04 DIAGNOSIS — K5010 Crohn's disease of large intestine without complications: Secondary | ICD-10-CM | POA: Diagnosis present

## 2024-03-04 DIAGNOSIS — Z1231 Encounter for screening mammogram for malignant neoplasm of breast: Secondary | ICD-10-CM

## 2024-03-04 MED ORDER — VEDOLIZUMAB 300 MG IV SOLR
300.0000 mg | Freq: Once | INTRAVENOUS | Status: AC
  Administered 2024-03-04: 10:00:00 300 mg via INTRAVENOUS
  Filled 2024-03-04: qty 5

## 2024-03-04 NOTE — Progress Notes (Signed)
 Diagnosis: Crohn's Disease  Provider:  Eartha Sieving MD  Procedure: IV Infusion  IV Type: Peripheral, IV Location: L Antecubital  Entyvio  (Vedolizumab ), Dose: 300 mg  Infusion Start Time: 0949  Infusion Stop Time: 1020  Post Infusion IV Care: Peripheral IV Discontinued  Discharge: Condition: Good, Destination: Home . AVS Provided  Performed by:  Delon ONEIDA Officer, RN

## 2024-03-04 NOTE — Telephone Encounter (Signed)
 Auth Submission: NO AUTH NEEDED Site of care: Site of care: [facility] Payer: medicare a/b Medication & CPT/J Code(s) submitted: Entyvio  (Vedolizumab ) [id number] Diagnosis Code:  Route of submission (phone, fax, portal): portal Phone # Fax # Auth type: Buy/Bill HB Units/visits requested: 300mg  initial doses, then q8weeks Reference number:  Approval from:03/04/24 to 03/18/24   Pt's supplement no longer active.

## 2024-03-09 ENCOUNTER — Ambulatory Visit: Payer: Self-pay | Admitting: Obstetrics and Gynecology

## 2024-03-18 ENCOUNTER — Encounter: Payer: Self-pay | Admitting: Gastroenterology

## 2024-03-19 ENCOUNTER — Telehealth: Payer: Self-pay

## 2024-03-19 NOTE — Telephone Encounter (Signed)
 Auth Submission: NO AUTH NEEDED Site of care: Site of care: [facility] Payer: medicare a/b Medication & CPT/J Code(s) submitted: Entyvio  (Vedolizumab ) J3380 Diagnosis Code:  Route of submission (phone, fax, portal): portal Phone # Fax # Auth type: Buy/Bill HB Units/visits requested: 300mg , q8weeks Reference number:  Approval from:03/19/24 to 03/18/25

## 2024-04-29 ENCOUNTER — Ambulatory Visit
